# Patient Record
Sex: Male | Born: 1965 | Race: White | Hispanic: No | Marital: Single | Smoking: Current every day smoker
Health system: Southern US, Community
[De-identification: ages and names within clinical notes are randomized; demographics above are authoritative.]

## PROBLEM LIST (undated history)

## (undated) DIAGNOSIS — J449 Chronic obstructive pulmonary disease, unspecified: Secondary | ICD-10-CM

## (undated) DIAGNOSIS — R42 Dizziness and giddiness: Secondary | ICD-10-CM

## (undated) DIAGNOSIS — M549 Dorsalgia, unspecified: Secondary | ICD-10-CM

## (undated) DIAGNOSIS — J45909 Unspecified asthma, uncomplicated: Secondary | ICD-10-CM

## (undated) DIAGNOSIS — G8929 Other chronic pain: Secondary | ICD-10-CM

## (undated) DIAGNOSIS — F1910 Other psychoactive substance abuse, uncomplicated: Secondary | ICD-10-CM

## (undated) HISTORY — PX: APPENDECTOMY: SHX54

---

## 2017-05-03 ENCOUNTER — Encounter (HOSPITAL_BASED_OUTPATIENT_CLINIC_OR_DEPARTMENT_OTHER): Payer: Self-pay | Admitting: *Deleted

## 2017-05-03 ENCOUNTER — Emergency Department (HOSPITAL_BASED_OUTPATIENT_CLINIC_OR_DEPARTMENT_OTHER)
Admission: EM | Admit: 2017-05-03 | Discharge: 2017-05-03 | Disposition: A | Discharge status: Home / Self Care | Payer: Self-pay | Attending: Emergency Medicine | Admitting: Emergency Medicine

## 2017-05-03 DIAGNOSIS — J029 Acute pharyngitis, unspecified: Secondary | ICD-10-CM | POA: Insufficient documentation

## 2017-05-03 DIAGNOSIS — F17210 Nicotine dependence, cigarettes, uncomplicated: Secondary | ICD-10-CM | POA: Insufficient documentation

## 2017-05-03 DIAGNOSIS — B370 Candidal stomatitis: Secondary | ICD-10-CM | POA: Insufficient documentation

## 2017-05-03 DIAGNOSIS — M549 Dorsalgia, unspecified: Secondary | ICD-10-CM | POA: Insufficient documentation

## 2017-05-03 HISTORY — DX: Unspecified asthma, uncomplicated: J45.909

## 2017-05-03 HISTORY — DX: Other chronic pain: G89.29

## 2017-05-03 HISTORY — DX: Dorsalgia, unspecified: M54.9

## 2017-05-03 LAB — RAPID STREP SCREEN (MED CTR MEBANE ONLY): Streptococcus, Group A Screen (Direct): NEGATIVE

## 2017-05-03 MED ORDER — PENICILLIN G BENZATHINE 1200000 UNIT/2ML IM SUSP
1.2000 10*6.[IU] | Freq: Once | INTRAMUSCULAR | Status: AC
  Administered 2017-05-03: 1.2 10*6.[IU] via INTRAMUSCULAR
  Filled 2017-05-03: qty 2

## 2017-05-03 MED ORDER — NYSTATIN 100000 UNIT/ML MT SUSP: 500000.0000 [IU] | Freq: Four times a day (QID) | OROMUCOSAL | 0 refills | Status: DC

## 2017-05-03 NOTE — Discharge Instructions (Signed)
Today I have given you a shot of penicillin for sore throat.  I have also given you a prescription for nystatin mouthwash as your initial strep test was negative.  Infection with HIV can make you at risk for oral thrush (mouth yeast infection).  Please follow up with your PCP or the health department for a HIV test.

## 2017-05-03 NOTE — ED Notes (Signed)
Pt given d/c instructions as per chart. Rx x 1. Verbalizes understanding. No questions. 

## 2017-05-03 NOTE — ED Provider Notes (Signed)
[facility]   CSN: [id number] Arrival date & time: 05/03/17  1432     History   Chief Complaint Chief Complaint  Patient presents with  . Sore Throat  . Back Pain    HPI Robert Swanson is a 51 y.o. male who presents with two days of sore throat.  He reports he has had oral thrush before when he was taking steroid inhalers for his asthma.  He reports he has been attempting to treat his sore throat with gargling peroxide and has been "sometimes" diluting the hydrogen peroxide before gargling.  He denies fevers, cough, nausea, vomiting, abdominal pain, known sick contacts.    He reports that, while he does have chronic back pain, That he does not want it addressed while he is here, stating that he has a pain doctor and plans to follow up with them. He reports that his pain has not changed.  Twice patient gave verbal permission to discuss any results, treatment recommendations for him with his girlfriend present.    HPI  Past Medical History:  Diagnosis Date  . Asthma   . Chronic back pain     There are no active problems to display for this patient.   History reviewed. No pertinent surgical history.     Home Medications    Prior to Admission medications   Medication Sig Start Date End Date Taking? Authorizing Provider  nystatin (MYCOSTATIN) 100000 UNIT/ML suspension Take 5 mLs (500,000 Units total) by mouth 4 (four) times daily. 05/03/17   Cristina GongHammond, Syerra Abdelrahman W, PA-C    Family History No family history on file.  Social History Social History  Substance Use Topics  . Smoking status: Current Every Day Smoker    Packs/day: 0.50    Types: Cigarettes  . Smokeless tobacco: Never Used  . Alcohol use Yes     Allergies   Patient has no known allergies.   Review of Systems Review of Systems  Constitutional: Negative for activity change, appetite change, diaphoresis, fatigue and fever.  HENT: Positive for sore throat. Negative for dental  problem, drooling, ear pain, facial swelling, mouth sores, postnasal drip, rhinorrhea, sinus pressure, trouble swallowing and voice change.   Respiratory: Negative for chest tightness, shortness of breath and wheezing.   Gastrointestinal: Negative for diarrhea, nausea and vomiting.  Musculoskeletal: Negative for arthralgias and myalgias.  Skin: Negative for rash.  Neurological: Negative for syncope, light-headedness and headaches.     Physical Exam Updated Vital Signs BP 136/71 (BP Location: Left Arm)   Pulse 80   Temp 98.2 F (36.8 C) (Oral)   Resp 20   Ht 5\' 4"  (1.626 m)   Wt 175 lb (79.4 kg)   SpO2 95%   BMI 30.04 kg/m   Physical Exam  Constitutional: He appears well-developed and well-nourished. No distress.  HENT:  Head: Normocephalic and atraumatic.  Right Ear: External ear normal.  Left Ear: External ear normal.  Mouth/Throat: Uvula is midline and mucous membranes are normal. No dental abscesses or lacerations. Oropharyngeal exudate and posterior oropharyngeal erythema present. No posterior oropharyngeal edema. Tonsils are 2+ on the right. Tonsils are 2+ on the left. Tonsillar exudate.  Diffuse white patches to mouth on tongue, posterior pharynx and tonsils. There is diffuse erythema to his posterior pharynx  Neck: Normal range of motion. Neck supple. No tracheal deviation present.  Cardiovascular: Normal rate.   Pulmonary/Chest: Effort normal and breath sounds normal. No stridor. No respiratory distress. He has no wheezes.  Lymphadenopathy:  He has no cervical adenopathy.  Neurological: He is alert.  Skin: Skin is warm and dry. He is not diaphoretic.  Nursing note and vitals reviewed.    ED Treatments / Results  Labs (all labs ordered are listed, but only abnormal results are displayed) Labs Reviewed  RAPID STREP SCREEN (NOT AT [facility])  CULTURE, GROUP A STREP [facility])    EKG  EKG Interpretation None       Radiology No results  found.  Procedures Procedures (including critical care time)  Medications Ordered in ED Medications  penicillin g benzathine (BICILLIN LA) 1200000 UNIT/2ML injection 1.2 Million Units (1.2 Million Units Intramuscular Given 05/03/17 1548)     Initial Impression / Assessment and Plan / ED Course  I have reviewed the triage vital signs and the nursing notes.  Pertinent labs & imaging results that were available during my care of the patient were reviewed by me and considered in my medical decision making (see chart for details).    Pt afebrile with tonsillar exudate, no cervical lymphadenopathy, but patient concern for strep.   Treated in the ED with PCN IM. Based on negative rapid strep test and thrush history patient will be given rx for nystatin mouth wash for oral candidiasis.  Patient was informed that he needs to follow up with health center for HIV test as he does not have risk factors for oral candidiasis and needs evaluation.   Presentation non concerning for PTA or infxn spread to soft tissue. No trismus or uvula deviation. Specific return precautions discussed. Pt able to drink water in ED without difficulty with intact air way. Recommended PCP follow up.    Final Clinical Impressions(s) / ED Diagnoses   Final diagnoses:  Sore throat  Oral thrush    New Prescriptions Discharge Medication List as of 05/03/2017  3:40 PM    START taking these medications   Details  nystatin (MYCOSTATIN) 100000 UNIT/ML suspension Take 5 mLs (500,000 Units total) by mouth 4 (four) times daily., Starting Sun 05/03/2017, Print         Cristina Gong, PA-C 05/03/17 1735    Maia Plan, MD 05/04/17 [phone]

## 2017-05-03 NOTE — ED Triage Notes (Signed)
Pt with sore throat for 2-3 days.  Pt was self treating with hydrogen peroxide as he thought he may have thrush (from using his asthma inhaler).  Pt has very red throat on exam.  Pt also has chronic back pain he would like help with, pain has been in place for 6-7 years, no recent change

## 2017-05-03 NOTE — ED Notes (Signed)
Pt denies any s/s of allergic reaction to injection.

## 2017-05-03 NOTE — ED Notes (Signed)
ED Provider at bedside. 

## 2017-05-05 LAB — CULTURE, GROUP A STREP (THRC)

## 2017-05-12 ENCOUNTER — Encounter (HOSPITAL_BASED_OUTPATIENT_CLINIC_OR_DEPARTMENT_OTHER): Payer: Self-pay | Admitting: *Deleted

## 2017-05-12 ENCOUNTER — Emergency Department (HOSPITAL_BASED_OUTPATIENT_CLINIC_OR_DEPARTMENT_OTHER): Payer: Self-pay

## 2017-05-12 ENCOUNTER — Emergency Department (HOSPITAL_BASED_OUTPATIENT_CLINIC_OR_DEPARTMENT_OTHER)
Admission: EM | Admit: 2017-05-12 | Discharge: 2017-05-12 | Disposition: A | Discharge status: Home / Self Care | Payer: Self-pay | Attending: Emergency Medicine | Admitting: Emergency Medicine

## 2017-05-12 DIAGNOSIS — J441 Chronic obstructive pulmonary disease with (acute) exacerbation: Secondary | ICD-10-CM | POA: Insufficient documentation

## 2017-05-12 DIAGNOSIS — F17210 Nicotine dependence, cigarettes, uncomplicated: Secondary | ICD-10-CM | POA: Insufficient documentation

## 2017-05-12 DIAGNOSIS — J45909 Unspecified asthma, uncomplicated: Secondary | ICD-10-CM | POA: Insufficient documentation

## 2017-05-12 DIAGNOSIS — Z79899 Other long term (current) drug therapy: Secondary | ICD-10-CM | POA: Insufficient documentation

## 2017-05-12 MED ORDER — ALBUTEROL SULFATE (2.5 MG/3ML) 0.083% IN NEBU: 5.0000 mg | INHALATION_SOLUTION | Freq: Once | RESPIRATORY_TRACT | Status: DC

## 2017-05-12 MED ORDER — IPRATROPIUM-ALBUTEROL 0.5-2.5 (3) MG/3ML IN SOLN
3.0000 mL | Freq: Once | RESPIRATORY_TRACT | Status: AC
  Administered 2017-05-12: 3 mL via RESPIRATORY_TRACT
  Filled 2017-05-12: qty 3

## 2017-05-12 MED ORDER — ALBUTEROL SULFATE (2.5 MG/3ML) 0.083% IN NEBU
2.5000 mg | INHALATION_SOLUTION | Freq: Once | RESPIRATORY_TRACT | Status: AC
  Administered 2017-05-12: 2.5 mg via RESPIRATORY_TRACT
  Filled 2017-05-12: qty 3

## 2017-05-12 MED ORDER — ALBUTEROL SULFATE HFA 108 (90 BASE) MCG/ACT IN AERS
2.0000 | INHALATION_SPRAY | Freq: Four times a day (QID) | RESPIRATORY_TRACT | Status: DC
  Administered 2017-05-12: 2 via RESPIRATORY_TRACT
  Filled 2017-05-12: qty 6.7

## 2017-05-12 NOTE — ED Notes (Signed)
Out HFA x3 days

## 2017-05-12 NOTE — ED Triage Notes (Signed)
Pt presents with SOB x 3 days. Out of inhaler. Wheezing. RT at bedside

## 2017-05-12 NOTE — ED Notes (Signed)
Patient transported to X-ray in bed.

## 2017-05-12 NOTE — ED Notes (Signed)
ED Provider at bedside. 

## 2017-05-12 NOTE — ED Provider Notes (Signed)
[facility]   CSN: [id number] Arrival date & time: 05/12/17  1057     History   Chief Complaint Chief Complaint  Patient presents with  . Shortness of Breath    HPI Robert Swanson is a 51 y.o. male.  Patient is a smoker. Patient with complaint of shortness of breath for 3 days. Is out of his inhaler. Patient arrived with wheezing. Received a nebulizer treatment prior to being seen by me. Patient states that he's had a history of asthma.  Will also have a history of COPD.       Past Medical History:  Diagnosis Date  . Asthma   . Chronic back pain     There are no active problems to display for this patient.   Past Surgical History:  Procedure Laterality Date  . APPENDECTOMY         Home Medications    Prior to Admission medications   Medication Sig Start Date End Date Taking? Authorizing Provider  albuterol (PROVENTIL HFA;VENTOLIN HFA) 108 (90 Base) MCG/ACT inhaler Inhale 2 puffs into the lungs every 6 (six) hours as needed for wheezing or shortness of breath.   Yes [provider]  nystatin (MYCOSTATIN) 100000 UNIT/ML suspension Take 5 mLs (500,000 Units total) by mouth 4 (four) times daily. 05/03/17   Cristina Gong, PA-C    Family History No family history on file.  Social History Social History  Substance Use Topics  . Smoking status: Current Every Day Smoker    Packs/day: 0.50    Types: Cigarettes  . Smokeless tobacco: Never Used  . Alcohol use Yes     Comment: denies current use     Allergies   Patient has no known allergies.   Review of Systems Review of Systems  Constitutional: Negative for fever.  HENT: Negative for congestion.   Eyes: Negative for visual disturbance.  Respiratory: Positive for cough, shortness of breath and wheezing.   Cardiovascular: Negative for chest pain.  Gastrointestinal: Negative for abdominal pain.  Genitourinary: Negative for dysuria.  Musculoskeletal: Negative  for back pain.  Skin: Negative for rash.  Neurological: Negative for headaches.  Hematological: Does not bruise/bleed easily.  Psychiatric/Behavioral: Negative for confusion.     Physical Exam Updated Vital Signs BP (!) 141/73   Pulse (!) 51   Temp 97.9 F (36.6 C) (Oral)   Resp 18   Ht 1.626 m (5\' 4" )   Wt 79.4 kg (175 lb)   SpO2 100%   BMI 30.04 kg/m   Physical Exam  Constitutional: He is oriented to person, place, and time. He appears well-developed and well-nourished. No distress.  HENT:  Head: Normocephalic and atraumatic.  Left Ear: External ear normal.  Eyes: Conjunctivae and EOM are normal. Pupils are equal, round, and reactive to light.  Neck: Normal range of motion.  Cardiovascular: Normal rate and regular rhythm.   Pulmonary/Chest: Effort normal. He has wheezes.  Abdominal: Soft. Bowel sounds are normal. There is no tenderness.  Musculoskeletal: Normal range of motion. He exhibits no edema.  Neurological: He is alert and oriented to person, place, and time. No sensory deficit. He exhibits normal muscle tone. Coordination normal.  Skin: Skin is warm.  Nursing note and vitals reviewed.    ED Treatments / Results  Labs (all labs ordered are listed, but only abnormal results are displayed) Labs Reviewed - No data to display  EKG  EKG Interpretation  Date/Time:  Tuesday May 12 2017 11:16:58 EDT Ventricular Rate:  60 PR Interval:    QRS Duration: 108 QT Interval:  404 QTC Calculation: 404 R Axis:   90 Text Interpretation:  Sinus rhythm Borderline right axis deviation Low voltage, precordial leads No previous ECGs available Confirmed by Vanetta MuldersZackowski, Danya Spearman [phone]) on 05/12/2017 11:21:14 AM       Radiology Dg Chest 2 View  Result Date: 05/12/2017 CLINICAL DATA:  Shortness of breath. EXAM: CHEST  2 VIEW COMPARISON:  No prior . FINDINGS: Mediastinum and hilar structures are normal. Lungs are clear. No pleural effusion or pneumothorax. Cardiomegaly with normal  pulmonary vascularity. No acute bony abnormality . IMPRESSION: 1. Cardiomegaly with normal pulmonary vascularity. 2. No acute pulmonary disease. Electronically Signed   By: Maisie Fushomas  Register   On: 05/12/2017 11:38    Procedures Procedures (including critical care time)  Medications Ordered in ED Medications  albuterol (PROVENTIL HFA;VENTOLIN HFA) 108 (90 Base) MCG/ACT inhaler 2 puff (not administered)  ipratropium-albuterol (DUONEB) 0.5-2.5 (3) MG/3ML nebulizer solution 3 mL (3 mLs Nebulization Given 05/12/17 1113)  albuterol (PROVENTIL) (2.5 MG/3ML) 0.083% nebulizer solution 2.5 mg (2.5 mg Nebulization Given 05/12/17 1113)  ipratropium-albuterol (DUONEB) 0.5-2.5 (3) MG/3ML nebulizer solution 3 mL (3 mLs Nebulization Given 05/12/17 1329)     Initial Impression / Assessment and Plan / ED Course  I have reviewed the triage vital signs and the nursing notes.  Pertinent labs & imaging results that were available during my care of the patient were reviewed by me and considered in my medical decision making (see chart for details).     Patient presenting for with shortness of breath and wheezing. Ran out of his inhaler. Has a history of asthma or COPD. He is a smoker. Chest x-ray here did today is negative for any acute findings. Patient improved after 2 nebulizer treatments. Wheezing has resolved. We'll provide him with an inhaler.  Final Clinical Impressions(s) / ED Diagnoses   Final diagnoses:  COPD exacerbation (HCC)    New Prescriptions New Prescriptions   No medications on file     Vanetta MuldersZackowski, Oswin Griffith, MD 05/12/17 1346

## 2017-05-12 NOTE — Discharge Instructions (Signed)
Use albuterol inhaler 2 puffs every 6 hours. Return for any new or worse symptoms.

## 2017-05-21 ENCOUNTER — Other Ambulatory Visit: Payer: Self-pay

## 2017-05-21 ENCOUNTER — Emergency Department (HOSPITAL_BASED_OUTPATIENT_CLINIC_OR_DEPARTMENT_OTHER)
Admission: EM | Admit: 2017-05-21 | Discharge: 2017-05-21 | Disposition: A | Discharge status: Home / Self Care | Payer: Self-pay | Attending: Emergency Medicine | Admitting: Emergency Medicine

## 2017-05-21 ENCOUNTER — Encounter (HOSPITAL_BASED_OUTPATIENT_CLINIC_OR_DEPARTMENT_OTHER): Payer: Self-pay | Admitting: Emergency Medicine

## 2017-05-21 DIAGNOSIS — J9801 Acute bronchospasm: Secondary | ICD-10-CM | POA: Insufficient documentation

## 2017-05-21 DIAGNOSIS — J449 Chronic obstructive pulmonary disease, unspecified: Secondary | ICD-10-CM | POA: Insufficient documentation

## 2017-05-21 DIAGNOSIS — F17210 Nicotine dependence, cigarettes, uncomplicated: Secondary | ICD-10-CM | POA: Insufficient documentation

## 2017-05-21 HISTORY — DX: Chronic obstructive pulmonary disease, unspecified: J44.9

## 2017-05-21 MED ORDER — ALBUTEROL SULFATE HFA 108 (90 BASE) MCG/ACT IN AERS
4.0000 | INHALATION_SPRAY | Freq: Once | RESPIRATORY_TRACT | Status: AC
  Administered 2017-05-21: 4 via RESPIRATORY_TRACT
  Filled 2017-05-21: qty 6.7

## 2017-05-21 NOTE — Discharge Instructions (Signed)
Return to the ED with any concerns including difficulty breathing despite using albuterol every 4 hours, not drinking fluids, decreased urine output, vomiting and not able to keep down liquids or medications, decreased level of alertness/lethargy, or any other alarming symptoms °

## 2017-05-21 NOTE — ED Triage Notes (Signed)
Pt c/o dizziness and nausea since yesterday.

## 2017-05-21 NOTE — ED Triage Notes (Signed)
Pt with slow speech and falls asleep quickly. Pt appears under the influence of a mood altering substance.

## 2017-05-21 NOTE — ED Provider Notes (Signed)
[facility]   CSN: [id number] Arrival date & time: 05/21/17  0433     History   Chief Complaint Chief Complaint  Patient presents with  . Dizziness    HPI Robert Swanson is a 51 y.o. male.  HPI  Pt with hx of asthma/copd and chronic back pain presenting with c/o feeling shortness of breath- he states that he lost his inhaler that was given to him several days ago.  He also c/o feeling some mild lightheadedness. Denies chest pain.  No leg swelling.  No fever/chill. Denies any change in sputum. States he had been doing well since ED visit several days ago until he lost the inhaler. There are no other associated systemic symptoms, there are no other alleviating or modifying factors.   Past Medical History:  Diagnosis Date  . Asthma   . Chronic back pain   . COPD (chronic obstructive pulmonary disease) (HCC)     There are no active problems to display for this patient.   Past Surgical History:  Procedure Laterality Date  . APPENDECTOMY         Home Medications    Prior to Admission medications   Medication Sig Start Date End Date Taking? Authorizing Provider  albuterol (PROVENTIL HFA;VENTOLIN HFA) 108 (90 Base) MCG/ACT inhaler Inhale 2 puffs into the lungs every 6 (six) hours as needed for wheezing or shortness of breath.    [provider]    Family History No family history on file.  Social History Social History  Substance Use Topics  . Smoking status: Current Every Day Smoker    Packs/day: 0.50    Types: Cigarettes  . Smokeless tobacco: Never Used  . Alcohol use Yes     Comment: denies current use     Allergies   Patient has no known allergies.   Review of Systems Review of Systems  ROS reviewed and all otherwise negative except for mentioned in HPI   Physical Exam Updated Vital Signs BP 118/71 (BP Location: Right Arm)   Pulse 68   Temp 97.7 F (36.5 C) (Oral)   Resp 16   Ht 5\' 4"  (1.626 m)   Wt 81.6 kg  (180 lb)   SpO2 98%   BMI 30.90 kg/m  Vitals reviewed Physical Exam Physical Examination: General appearance - alert, well appearing, and in no distress Mental status - alert, oriented to person, place, and time Eyes - no conjunctival injection, no scleral icterus Chest - BSS, bilateral expiratory wheezing, normal respiraotory effort Heart - normal rate, regular rhythm, normal S1, S2, no murmurs, rubs, clicks or gallops Abdomen - soft, nontender, nondistended, no masses or organomegaly Neurological - alert, oriented x 3, normal speech, but appears tired Extremities - peripheral pulses normal, no pedal edema, no clubbing or cyanosis Skin - normal coloration and turgor, no rashes  ED Treatments / Results  Labs (all labs ordered are listed, but only abnormal results are displayed) Labs Reviewed - No data to display  EKG  EKG Interpretation None       Date: 05/21/2017  Rate: 61  Rhythm: normal sinus rhythm  QRS Axis: normal  Intervals: normal  ST/T Wave abnormalities: normal  Conduction Disutrbances: none  Narrative Interpretation: unremarkable    Radiology No results found.  Procedures Procedures (including critical care time)  Medications Ordered in ED Medications  albuterol (PROVENTIL HFA;VENTOLIN HFA) 108 (90 Base) MCG/ACT inhaler 4 puff (4 puffs Inhalation Given 05/21/17 0523)     Initial Impression /  Assessment and Plan / ED Course  I have reviewed the triage vital signs and the nursing notes.  Pertinent labs & imaging results that were available during my care of the patient were reviewed by me and considered in my medical decision making (see chart for details).     Pt presenting with ongoing wheezing since losing the inhaler that was given to him at last ED visit.  Pt is is no distress, wheezing on exam is mild.  EKG reassuring.  Doubt pneumonia, ACS, or other acute emergent process at this time. Pt is requesting ativan for anxiety but appears very sleepy.   I have discussed with him that I do no feel comfortable giving a sedative medication to someone who already appears sedated.  He also discusses that he can no longer afford to see his pain clinic for his chornic pain meds.  No indication for pain meds at this time.  Discharged with strict return precautions.  Pt agreeable with plan.  Final Clinical Impressions(s) / ED Diagnoses   Final diagnoses:  Bronchospasm    New Prescriptions Discharge Medication List as of 05/21/2017  6:27 AM       Jerelyn Scott, MD 05/22/17 [phone]

## 2017-05-23 ENCOUNTER — Emergency Department (HOSPITAL_BASED_OUTPATIENT_CLINIC_OR_DEPARTMENT_OTHER)
Admission: EM | Admit: 2017-05-23 | Discharge: 2017-05-23 | Disposition: A | Discharge status: Home / Self Care | Payer: Self-pay | Attending: Emergency Medicine | Admitting: Emergency Medicine

## 2017-05-23 ENCOUNTER — Encounter (HOSPITAL_BASED_OUTPATIENT_CLINIC_OR_DEPARTMENT_OTHER): Payer: Self-pay

## 2017-05-23 ENCOUNTER — Emergency Department (HOSPITAL_BASED_OUTPATIENT_CLINIC_OR_DEPARTMENT_OTHER): Payer: Self-pay

## 2017-05-23 DIAGNOSIS — R110 Nausea: Secondary | ICD-10-CM | POA: Insufficient documentation

## 2017-05-23 DIAGNOSIS — G8929 Other chronic pain: Secondary | ICD-10-CM | POA: Insufficient documentation

## 2017-05-23 DIAGNOSIS — J45909 Unspecified asthma, uncomplicated: Secondary | ICD-10-CM | POA: Insufficient documentation

## 2017-05-23 DIAGNOSIS — R42 Dizziness and giddiness: Secondary | ICD-10-CM

## 2017-05-23 DIAGNOSIS — J449 Chronic obstructive pulmonary disease, unspecified: Secondary | ICD-10-CM | POA: Insufficient documentation

## 2017-05-23 DIAGNOSIS — F17210 Nicotine dependence, cigarettes, uncomplicated: Secondary | ICD-10-CM | POA: Insufficient documentation

## 2017-05-23 DIAGNOSIS — R002 Palpitations: Secondary | ICD-10-CM | POA: Insufficient documentation

## 2017-05-23 LAB — RAPID URINE DRUG SCREEN, HOSP PERFORMED
Amphetamines: NOT DETECTED
Barbiturates: NOT DETECTED
Benzodiazepines: NOT DETECTED
Cocaine: POSITIVE — AB
Opiates: NOT DETECTED
Tetrahydrocannabinol: NOT DETECTED

## 2017-05-23 LAB — CBC WITH DIFFERENTIAL/PLATELET
Basophils Absolute: 0.1 10*3/uL (ref 0.0–0.1)
Basophils Relative: 1 %
Eosinophils Absolute: 0.4 10*3/uL (ref 0.0–0.7)
Eosinophils Relative: 3 %
HCT: 44.1 % (ref 39.0–52.0)
Hemoglobin: 14.5 g/dL (ref 13.0–17.0)
Lymphocytes Relative: 15 %
Lymphs Abs: 1.6 10*3/uL (ref 0.7–4.0)
MCH: 30.3 pg (ref 26.0–34.0)
MCHC: 32.9 g/dL (ref 30.0–36.0)
MCV: 92.1 fL (ref 78.0–100.0)
Monocytes Absolute: 0.8 10*3/uL (ref 0.1–1.0)
Monocytes Relative: 7 %
Neutro Abs: 8.3 10*3/uL — ABNORMAL HIGH (ref 1.7–7.7)
Neutrophils Relative %: 74 %
Platelets: 308 10*3/uL (ref 150–400)
RBC: 4.79 MIL/uL (ref 4.22–5.81)
RDW: 13.8 % (ref 11.5–15.5)
WBC: 11.1 10*3/uL — ABNORMAL HIGH (ref 4.0–10.5)

## 2017-05-23 LAB — COMPREHENSIVE METABOLIC PANEL
ALT: 12 U/L — ABNORMAL LOW (ref 17–63)
AST: 13 U/L — ABNORMAL LOW (ref 15–41)
Albumin: 3.8 g/dL (ref 3.5–5.0)
Alkaline Phosphatase: 53 U/L (ref 38–126)
Anion gap: 6 (ref 5–15)
BUN: 15 mg/dL (ref 6–20)
CO2: 28 mmol/L (ref 22–32)
Calcium: 9.1 mg/dL (ref 8.9–10.3)
Chloride: 107 mmol/L (ref 101–111)
Creatinine, Ser: 0.9 mg/dL (ref 0.61–1.24)
GFR calc Af Amer: >60 mL/min (ref >60)
GFR calc non Af Amer: >60 mL/min (ref >60)
Glucose, Bld: 101 mg/dL — ABNORMAL HIGH (ref 65–99)
Potassium: 3.8 mmol/L (ref 3.5–5.1)
Sodium: 141 mmol/L (ref 135–145)
Total Bilirubin: 0.8 mg/dL (ref 0.3–1.2)
Total Protein: 6.6 g/dL (ref 6.5–8.1)

## 2017-05-23 LAB — MAGNESIUM: Magnesium: 1.9 mg/dL (ref 1.7–2.4)

## 2017-05-23 LAB — TROPONIN I: Troponin I: <0.03 ng/mL (ref <0.03)

## 2017-05-23 LAB — D-DIMER, QUANTITATIVE (NOT AT ARMC): D-Dimer, Quant: <0.27 ug/mL-FEU (ref 0.00–0.50)

## 2017-05-23 MED ORDER — SODIUM CHLORIDE 0.9 % IV BOLUS (SEPSIS)
1000.0000 mL | Freq: Once | INTRAVENOUS | Status: AC
  Administered 2017-05-23: 1000 mL via INTRAVENOUS

## 2017-05-23 NOTE — ED Notes (Signed)
Pt reports intermittent lightheadedness that has been going on 6 mo to a year. Pt reports that it has recently been worse. Pt states "I have been scared, and I am afraid to go to sleep. I will just walk around at night." Pt reports being given ativan in the past and that has helped his symptoms. Pt states when he gets scared the lightheadedness gets worse, he feels like the walls close in, his heart races.

## 2017-05-23 NOTE — ED Triage Notes (Addendum)
PT reports dizziness, shortness of breath since 0600 - states intermittent - denies pain. Reports he is annoyed by certain things and he is scared to lay down. He reports he "feels safe here in the ER."

## 2017-05-23 NOTE — ED Provider Notes (Signed)
[facility] Provider Note   CSN: [id number] Arrival date & time: 05/23/17  1417  By signing my name below, I, Diona BrownerJennifer Gorman, attest that this documentation has been prepared under the direction and in the presence of Alvira MondaySchlossman, Arlynn Mcdermid, MD. Electronically Signed: Diona BrownerJennifer Gorman, ED Scribe. 05/23/17. 3:42 PM.  History   Chief Complaint Chief Complaint  Patient presents with  . Dizziness    HPI Robert Swanson is a 51 y.o. male who presents to the Emergency Department complaining of gradually worsening, intermittent dizziness that started a few days ago. He feels like if he lies down he will stop breathing and won't wake up. Pt reports he has been feeling this way for the last 6 months. Associated sx include nausea, heart palpitations, SOB (7/10), wheezing, non productive cough, chills, subjective fever, diaphoresis, feels like he is going to pass out when he is in the heat. Walking improves his dizziness. No new medications. Stopped taking oxycodone for his back ~ month. Doesn't drink ETOH. He does smokes. He hasn't been evaluated for his sx in the past by anyone other than the ED. FHx of anxiety, but not sudden death. Pt denies CP, vomiting, black and tarry stool, blood in stool, sore throat, rhinorrhea, dysuria, and leg pain or swelling.  The history is provided by the patient. No language interpreter was used.    Past Medical History:  Diagnosis Date  . Asthma   . Chronic back pain   . COPD (chronic obstructive pulmonary disease) (HCC)     There are no active problems to display for this patient.   Past Surgical History:  Procedure Laterality Date  . APPENDECTOMY         Home Medications    Prior to Admission medications   Medication Sig Start Date End Date Taking? Authorizing Provider  albuterol (PROVENTIL HFA;VENTOLIN HFA) 108 (90 Base) MCG/ACT inhaler Inhale 2 puffs into the lungs every 6 (six) hours as needed for wheezing or shortness of breath.   Yes  [provider]    Family History History reviewed. No pertinent family history.  Social History Social History  Substance Use Topics  . Smoking status: Current Every Day Smoker    Packs/day: 0.50    Types: Cigarettes  . Smokeless tobacco: Never Used  . Alcohol use Yes     Comment: denies current use     Allergies   Patient has no known allergies.   Review of Systems Review of Systems  Constitutional: Positive for chills, diaphoresis and fatigue. Negative for fever (feels hot off and on).  HENT: Positive for congestion. Negative for rhinorrhea and sore throat.   Eyes: Negative for visual disturbance.  Respiratory: Positive for cough (occasional), shortness of breath (off and on, worse today) and wheezing.   Cardiovascular: Negative for chest pain and leg swelling.  Gastrointestinal: Positive for nausea. Negative for abdominal pain, constipation, diarrhea and vomiting.  Genitourinary: Negative for difficulty urinating and dysuria.  Musculoskeletal: Negative for back pain and neck stiffness.  Skin: Negative for rash.  Neurological: Positive for dizziness and light-headedness. Negative for syncope, facial asymmetry, speech difficulty, weakness, numbness and headaches.  All other systems reviewed and are negative.    Physical Exam Updated Vital Signs BP 116/65   Pulse 73   Temp 98.2 F (36.8 C) (Oral)   Resp (!) 21   Ht 5\' 4"  (1.626 m)   Wt 81.6 kg (180 lb)   SpO2 95%   BMI 30.90 kg/m   Physical Exam  Constitutional: He is oriented to person, place, and time. He appears well-developed and well-nourished. No distress.  HENT:  Head: Normocephalic and atraumatic.  Eyes: Conjunctivae and EOM are normal. Pupils are equal, round, and reactive to light.  Neck: Normal range of motion.  Cardiovascular: Normal rate, regular rhythm, normal heart sounds and intact distal pulses.  Exam reveals no gallop and no friction rub.   No murmur heard. Pulmonary/Chest: Effort  normal. No respiratory distress. He has wheezes (diffuse). He has no rales. He exhibits no tenderness.  Abdominal: Soft. Bowel sounds are normal. He exhibits no distension. There is no tenderness. There is no guarding.  Musculoskeletal: He exhibits no edema.  Neurological: He is alert and oriented to person, place, and time. No cranial nerve deficit or sensory deficit. He exhibits normal muscle tone. Coordination normal. GCS eye subscore is 4. GCS verbal subscore is 5. GCS motor subscore is 6.   Strength and sensation equal and intact bilaterally throughout the upper and lower extremities. Coordination intact.    Skin: Skin is warm and dry. He is not diaphoretic.  Nursing note and vitals reviewed.    ED Treatments / Results  DIAGNOSTIC STUDIES: Oxygen Saturation is 94% on RA, adequate by my interpretation.   COORDINATION OF CARE: 3:42 PM-Discussed next steps with pt which includes blood work and possible CT if blood work comes back positive and following up with a PCP. Pt verbalized understanding and is agreeable with the plan.   Labs (all labs ordered are listed, but only abnormal results are displayed) Labs Reviewed  CBC WITH DIFFERENTIAL/PLATELET - Abnormal; Notable for the following:       Result Value   WBC 11.1 (*)    Neutro Abs 8.3 (*)    All other components within normal limits  COMPREHENSIVE METABOLIC PANEL - Abnormal; Notable for the following:    Glucose, Bld 101 (*)    AST 13 (*)    ALT 12 (*)    All other components within normal limits  RAPID URINE DRUG SCREEN, HOSP PERFORMED - Abnormal; Notable for the following:    Cocaine POSITIVE (*)    All other components within normal limits  MAGNESIUM  D-DIMER, QUANTITATIVE (NOT AT [facility])  TROPONIN I    EKG  EKG Interpretation  Date/Time:  Saturday May 23 2017 14:22:19 EDT Ventricular Rate:  76 PR Interval:    QRS Duration: 109 QT Interval:  382 QTC Calculation: 430 R Axis:   -146 Text Interpretation:  Sinus  rhythm Right axis deviation No significant change since last tracing Reconfirmed by Alvira Monday ([id number]) on 05/23/2017 4:56:17 PM       Radiology Dg Chest 2 View  Result Date: 05/23/2017 CLINICAL DATA:  Shortness of breath and cardiac palpitations EXAM: CHEST  2 VIEW COMPARISON:  May 12, 2017 FINDINGS: There is no edema or consolidation. Heart size and pulmonary vascularity are normal. No adenopathy. No evident bone lesions. IMPRESSION: No edema or consolidation. Electronically Signed   By: Bretta Bang III M.D.   On: 05/23/2017 15:59    Procedures Procedures (including critical care time)  Medications Ordered in ED Medications  sodium chloride 0.9 % bolus 1,000 mL (0 mLs Intravenous Stopped 05/23/17 1837)     Initial Impression / Assessment and Plan / ED Course  I have reviewed the triage vital signs and the nursing notes.  Pertinent labs & imaging results that were available during my care of the patient were reviewed by me and considered in my medical decision  making (see chart for details).     51 year old male with a history of COPD, chronic back pain presents with concern for intermittent episodes of lightheadedness over the last 6 months, with this episode beginning 2 days ago.  Patient was seen in the emergency department 2 days ago for the same. EKG shows no significant change from prior. CBC shows no significant anemia. No significant electrolyte abnormalities.  Chest x-ray shows no acute abnormalities.Given dyspnea, d-dimer was ordered which was negative.  Troponin was negative. Urinalysis shows no sign of infection.  Cocaine positive on UDS.  Patient is hemodynamically stable in the emergency department, with normal heart rhythm on telemetry. His nausea and chills may be secondary to opiate withdrawal. Discussed that his other symptoms may be secondary to anxiety, however recommend more thorough outpatient evaluation for etiology, including Holter monitoring.   Final  Clinical Impressions(s) / ED Diagnoses   Final diagnoses:  Nausea  Lightheadedness  Palpitations    New Prescriptions Discharge Medication List as of 05/23/2017  6:30 PM     I personally performed the services described in this documentation, which was scribed in my presence. The recorded information has been reviewed and is accurate.     Alvira Monday, MD 05/24/17 [phone]

## 2017-05-23 NOTE — ED Notes (Signed)
Pt denies any chest pain, reports some ShOB that is he attributes to his COPD at this time.

## 2017-06-14 ENCOUNTER — Emergency Department (HOSPITAL_BASED_OUTPATIENT_CLINIC_OR_DEPARTMENT_OTHER)
Admission: EM | Admit: 2017-06-14 | Discharge: 2017-06-14 | Disposition: A | Discharge status: Home / Self Care | Payer: Self-pay | Attending: Emergency Medicine | Admitting: Emergency Medicine

## 2017-06-14 ENCOUNTER — Encounter (HOSPITAL_BASED_OUTPATIENT_CLINIC_OR_DEPARTMENT_OTHER): Payer: Self-pay | Admitting: *Deleted

## 2017-06-14 DIAGNOSIS — R05 Cough: Secondary | ICD-10-CM | POA: Insufficient documentation

## 2017-06-14 DIAGNOSIS — F17210 Nicotine dependence, cigarettes, uncomplicated: Secondary | ICD-10-CM | POA: Insufficient documentation

## 2017-06-14 DIAGNOSIS — Z7951 Long term (current) use of inhaled steroids: Secondary | ICD-10-CM | POA: Insufficient documentation

## 2017-06-14 DIAGNOSIS — J441 Chronic obstructive pulmonary disease with (acute) exacerbation: Secondary | ICD-10-CM | POA: Insufficient documentation

## 2017-06-14 MED ORDER — ALBUTEROL SULFATE HFA 108 (90 BASE) MCG/ACT IN AERS: 2.0000 | INHALATION_SPRAY | RESPIRATORY_TRACT | 2 refills | Status: DC | PRN

## 2017-06-14 MED ORDER — ALBUTEROL SULFATE HFA 108 (90 BASE) MCG/ACT IN AERS
2.0000 | INHALATION_SPRAY | Freq: Once | RESPIRATORY_TRACT | Status: AC
  Administered 2017-06-14: 2 via RESPIRATORY_TRACT
  Filled 2017-06-14: qty 6.7

## 2017-06-14 MED ORDER — ALBUTEROL SULFATE HFA 108 (90 BASE) MCG/ACT IN AERS
INHALATION_SPRAY | RESPIRATORY_TRACT | Status: AC
  Administered 2017-06-14: 2 via RESPIRATORY_TRACT
  Filled 2017-06-14: qty 6.7

## 2017-06-14 MED ORDER — IPRATROPIUM BROMIDE 0.02 % IN SOLN
0.5000 mg | Freq: Once | RESPIRATORY_TRACT | Status: AC
  Administered 2017-06-14: 0.5 mg via RESPIRATORY_TRACT
  Filled 2017-06-14: qty 2.5

## 2017-06-14 MED ORDER — PREDNISONE 50 MG PO TABS
60.0000 mg | ORAL_TABLET | Freq: Once | ORAL | Status: AC
  Administered 2017-06-14: 06:00:00 60 mg via ORAL
  Filled 2017-06-14: qty 1

## 2017-06-14 MED ORDER — ALBUTEROL SULFATE (2.5 MG/3ML) 0.083% IN NEBU
5.0000 mg | INHALATION_SOLUTION | Freq: Once | RESPIRATORY_TRACT | Status: AC
  Administered 2017-06-14: 5 mg via RESPIRATORY_TRACT
  Filled 2017-06-14: qty 6

## 2017-06-14 MED ORDER — PREDNISONE 20 MG PO TABS: 60.0000 mg | ORAL_TABLET | Freq: Every day | ORAL | 0 refills | Status: DC

## 2017-06-14 NOTE — ED Provider Notes (Signed)
TIME SEEN: 5:52 AM  CHIEF COMPLAINT: Cough, wheezing  HPI: Patient is a 51 year old male with history of asthma, COPD, chronic back pain, tobacco use who presents emergency department with several days of dry cough, wheezing. States he ran out of his albuterol inhaler. Has also been without his Symbicort for several months after losing his job and insurance.  He reports he cannot afford these medications and that his [facility] was giving him "samples" of both. 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 States when he begins to feel like this he knows it's time for breathing treatment and steroids. He denies any chest pain or chest discomfort. No fevers or chills. No leg swelling or pain. In no significant distress. No significant shortness of breath.  Does not wear oxygen chronically.  Reports he does not currently have a primary care physician.  ROS: See HPI Constitutional: no fever  Eyes: no drainage  ENT: no runny nose   Cardiovascular:  no chest pain  Resp: no SOB  GI: no vomiting GU: no dysuria Integumentary: no rash  Allergy: no hives  Musculoskeletal: no leg swelling  Neurological: no slurred speech ROS otherwise negative  PAST MEDICAL HISTORY/PAST SURGICAL HISTORY:  Past Medical History:  Diagnosis Date  . Asthma   . Chronic back pain   . COPD (chronic obstructive pulmonary disease) (HCC)     MEDICATIONS:  Prior to Admission medications   Medication Sig Start Date End Date Taking? Authorizing Provider  albuterol (PROVENTIL HFA;VENTOLIN HFA) 108 (90 Base) MCG/ACT inhaler Inhale 2 puffs into the lungs every 6 (six) hours as needed for wheezing or shortness of breath.    [provider]    ALLERGIES:  No Known Allergies  SOCIAL HISTORY:  Social History  Substance Use Topics  . Smoking status: Current Every Day  Smoker    Packs/day: 0.50    Types: Cigarettes  . Smokeless tobacco: Never Used  . Alcohol use Yes     Comment: denies current use    FAMILY HISTORY: No family history on file.  EXAM: BP 124/66 (BP Location: Right Arm)   Pulse 80   Temp 97.9 F (36.6 C) (Oral)   Resp 18   Ht 5\' 4"  (1.626 m)   Wt 81.6 kg (180 lb)   SpO2 95%   BMI 30.90 kg/m  CONSTITUTIONAL: Alert and oriented and responds appropriately to questions. Well-appearing; well-nourished HEAD: Normocephalic EYES: Conjunctivae clear, pupils appear equal, EOMI ENT: normal nose; moist mucous membranes NECK: Supple, no meningismus, no nuchal rigidity, no LAD  CARD: RRR; S1 and S2 appreciated; no murmurs, no clicks, no rubs, no gallops RESP: Normal chest excursion without splinting or tachypnea; breath sounds equal bilaterally; Scattered expiratory wheezes, no rhonchi, no rales, no hypoxia or respiratory distress, speaking full sentences ABD/GI: Normal bowel sounds; non-distended; soft, non-tender, no rebound, no guarding, no peritoneal signs, no hepatosplenomegaly BACK:  The back appears normal and is non-tender to palpation, there is no CVA tenderness EXT: Normal ROM in all joints; non-tender to palpation; no edema; normal capillary refill; no cyanosis, no calf tenderness or swelling    SKIN: Normal color for age and race; warm; no rash NEURO: Moves all extremities equally PSYCH: The patient's mood and manner are appropriate. Grooming and personal hygiene are appropriate.  MEDICAL DECISION MAKING: Patient here with mild wheezing  and dry cough. Does have history of asthma, COPD. Not hypoxic here and in no respiratory distress. We'll give albuterol, Atrovent, prednisone. We'll also give him an albuterol inhaler to take home with him. He has no chest pain or shortness of breath. No fevers. No asymmetry when I listen to his lungs. We have discussed obtaining a chest x-ray and he would like to hold off at this time which I agree  is reasonable. I do not think this is a pneumothorax or pneumonia. No sign of volume overload.  ED PROGRESS: 6:25 AM  Pt's Lungs are almost completely clear after one breathing treatment. Given 5 mg albuterol, 0.5 mg Atrovent. Have offered him a second breathing treatment but he states he is feeling much better and feels ready for discharge. He has been provided with an albuterol inhaler to take home and prescription for steroid burst and more albuterol inhalers with refills. He states that he cannot afford Symbicort at this time. He previously had a nebulizer machine stated broke. He was given a prescription for a new machine by his primary care physician but states he cannot afford this either. He states he should be getting insurance with his new job soon and is also filing for disability. I have given him a list of primary care physicians that may work with him without insurance and help him obtain his prescriptions. We have discussed looking for coupons for medications on the Internet including [email or web address].  Discussed return precautions. Patient verbalizes understanding and is comfortable with this plan.  At this time, I do not feel there is any life-threatening condition present. I have reviewed and discussed all results (EKG, imaging, lab, urine as appropriate) and exam findings with patient/family. I have reviewed nursing notes and appropriate previous records.  I feel the patient is safe to be discharged home without further emergent workup and can continue workup as an outpatient as needed. Discussed usual and customary return precautions. Patient/family verbalize understanding and are comfortable with this plan.  Outpatient follow-up has been provided if needed. All questions have been answered.        Tiearra Colwell, Layla Maw, DO 06/14/17 [phone]

## 2017-06-14 NOTE — ED Notes (Signed)
ED Provider at bedside. 

## 2017-06-14 NOTE — ED Triage Notes (Signed)
Pt reports wheezing and cough for several days. He reports he ran out of his inhaler.

## 2017-06-14 NOTE — Discharge Instructions (Addendum)
GoodRx.com is a website you can use to find coupons to help with your prescriptions.   To find a primary care or specialty doctor please call [phone] or [phone] to access "[place] Find a Doctor Service."  You may also go on the [facility] website at [email or web address]  There are also multiple [facility], [facility], [facility] and [facility] practices throughout the [place] that are frequently accepting new patients. You may find a clinic that is close to your home and contact them.  [facility] -  [address] [place] [phone]   [facility] -  [address] [place] [phone] [phone]   [facility] [phone]  [place] [phone]

## 2017-06-21 ENCOUNTER — Emergency Department (HOSPITAL_BASED_OUTPATIENT_CLINIC_OR_DEPARTMENT_OTHER)
Admission: EM | Admit: 2017-06-21 | Discharge: 2017-06-21 | Disposition: A | Discharge status: Home / Self Care | Payer: Self-pay | Attending: Emergency Medicine | Admitting: Emergency Medicine

## 2017-06-21 ENCOUNTER — Encounter (HOSPITAL_BASED_OUTPATIENT_CLINIC_OR_DEPARTMENT_OTHER): Payer: Self-pay | Admitting: Emergency Medicine

## 2017-06-21 DIAGNOSIS — J45909 Unspecified asthma, uncomplicated: Secondary | ICD-10-CM | POA: Insufficient documentation

## 2017-06-21 DIAGNOSIS — J441 Chronic obstructive pulmonary disease with (acute) exacerbation: Secondary | ICD-10-CM | POA: Insufficient documentation

## 2017-06-21 DIAGNOSIS — F17210 Nicotine dependence, cigarettes, uncomplicated: Secondary | ICD-10-CM | POA: Insufficient documentation

## 2017-06-21 MED ORDER — PREDNISONE 50 MG PO TABS
60.0000 mg | ORAL_TABLET | Freq: Once | ORAL | Status: AC
  Administered 2017-06-21: 09:00:00 60 mg via ORAL
  Filled 2017-06-21: qty 1

## 2017-06-21 MED ORDER — ALBUTEROL SULFATE HFA 108 (90 BASE) MCG/ACT IN AERS
2.0000 | INHALATION_SPRAY | RESPIRATORY_TRACT | Status: DC
  Administered 2017-06-21: 2 via RESPIRATORY_TRACT
  Filled 2017-06-21: qty 6.7

## 2017-06-21 MED ORDER — IPRATROPIUM BROMIDE 0.02 % IN SOLN
0.5000 mg | Freq: Once | RESPIRATORY_TRACT | Status: AC
  Administered 2017-06-21: 0.5 mg via RESPIRATORY_TRACT
  Filled 2017-06-21: qty 2.5

## 2017-06-21 MED ORDER — ALBUTEROL SULFATE (2.5 MG/3ML) 0.083% IN NEBU
5.0000 mg | INHALATION_SOLUTION | Freq: Once | RESPIRATORY_TRACT | Status: AC
  Administered 2017-06-21: 5 mg via RESPIRATORY_TRACT
  Filled 2017-06-21: qty 6

## 2017-06-21 MED ORDER — PREDNISONE 50 MG PO TABS: 50.0000 mg | ORAL_TABLET | Freq: Every day | ORAL | 0 refills | Status: DC

## 2017-06-21 NOTE — ED Triage Notes (Signed)
States " I lost my inhaler and I was here last week and I did not get my steroid prescription filled " SOB with exertion

## 2017-06-21 NOTE — ED Provider Notes (Signed)
[facility] Provider Note   CSN: [id number] Arrival date & time: 06/21/17  0801     History   Chief Complaint Chief Complaint  Patient presents with  . Shortness of Breath    HPI Robert Swanson is a 51 y.o. male.  HPI  Pt with hx of asthma/copd presenting with c/o ongoign shortness of breath and wheezing.  Was seen in the ED last week and states since that time he has not been able to get steroid prescription filled- he assumed it would be expensive.  He also states he lost the albuterol inhaler.  He c/o mild wheezing. No fever, some cough.  No chest pain or legs swelling.  There are no other associated systemic symptoms, there are no other alleviating or modifying factors.   Past Medical History:  Diagnosis Date  . Asthma   . Chronic back pain   . COPD (chronic obstructive pulmonary disease) (HCC)     There are no active problems to display for this patient.   Past Surgical History:  Procedure Laterality Date  . APPENDECTOMY         Home Medications    Prior to Admission medications   Medication Sig Start Date End Date Taking? Authorizing Provider  albuterol (PROVENTIL HFA;VENTOLIN HFA) 108 (90 Base) MCG/ACT inhaler Inhale 2 puffs into the lungs every 6 (six) hours as needed for wheezing or shortness of breath.    [provider]  albuterol (PROVENTIL HFA;VENTOLIN HFA) 108 (90 Base) MCG/ACT inhaler Inhale 2 puffs into the lungs every 4 (four) hours as needed for wheezing or shortness of breath. 06/14/17   Ward, Layla Maw, DO  predniSONE (DELTASONE) 50 MG tablet Take 1 tablet (50 mg total) by mouth daily. 06/21/17   Jerelyn Scott, MD    Family History No family history on file.  Social History Social History  Substance Use Topics  . Smoking status: Current Every Day Smoker    Packs/day: 0.50    Types: Cigarettes  . Smokeless tobacco: Never Used  . Alcohol use Yes     Comment: denies current use     Allergies   Patient has no known  allergies.   Review of Systems Review of Systems  ROS reviewed and all otherwise negative except for mentioned in HPI   Physical Exam Updated Vital Signs BP 114/71 (BP Location: Right Arm)   Pulse 78   Temp 98.2 F (36.8 C) (Oral)   Resp 18   Ht 5\' 5"  (1.651 m)   Wt 81.6 kg (180 lb)   SpO2 97%   BMI 29.95 kg/m  Vitals reviewed Physical Exam Physical Examination: General appearance - alert, well appearing, and in no distress Mental status - alert, oriented to person, place, and time Eyes -no conjunctival injection, no scleral icterus Mouth - mucous membranes moist, pharynx normal without lesions Neck - supple, no significant adenopathy Chest - BSS, mild bilateral wheezing, normal respiratory effort Heart - normal rate, regular rhythm, normal S1, S2, no murmurs, rubs, clicks or gallops Abdomen - soft, nontender, nondistended, no masses or organomegaly Neurological - alert, oriented, normal speech Extremities - peripheral pulses normal, no pedal edema, no clubbing or cyanosis Skin - normal coloration and turgor, no rashes  ED Treatments / Results  Labs (all labs ordered are listed, but only abnormal results are displayed) Labs Reviewed - No data to display  EKG  EKG Interpretation None       Radiology No results found.  Procedures Procedures (including critical care  time)  Medications Ordered in ED Medications  albuterol (PROVENTIL) (2.5 MG/3ML) 0.083% nebulizer solution 5 mg (5 mg Nebulization Given 06/21/17 0846)  ipratropium (ATROVENT) nebulizer solution 0.5 mg (0.5 mg Nebulization Given 06/21/17 0846)  predniSONE (DELTASONE) tablet 60 mg (60 mg Oral Given 06/21/17 0902)     Initial Impression / Assessment and Plan / ED Course  I have reviewed the triage vital signs and the nursing notes.  Pertinent labs & imaging results that were available during my care of the patient were reviewed by me and considered in my medical decision making (see chart for  details).     Pt presenting due to wheezing- he was seen in the ED last week, given albuterol inhaler which he has lost and rx for steroids which he has not had filled due to concern about pricing.  Pt given albuterol MDI in the ED.  Given first dose of prednisone- pt states he wil be able to get prednisone rx filled tomorrow.  Discharged with strict return precautions.  Pt agreeable with plan.  Final Clinical Impressions(s) / ED Diagnoses   Final diagnoses:  COPD exacerbation [facility](HCC)    New Prescriptions Discharge Medication List as of 06/21/2017  9:29 AM       Jerelyn ScottLinker, Kaylei Frink, MD 06/25/17 [phone]

## 2017-06-21 NOTE — Discharge Instructions (Signed)
Return to the ED with any concerns including difficulty breathing despite using albuterol every 4 hours, not drinking fluids, decreased urine output, vomiting and not able to keep down liquids or medications, decreased level of alertness/lethargy, or any other alarming symptoms °

## 2017-06-26 ENCOUNTER — Encounter (HOSPITAL_BASED_OUTPATIENT_CLINIC_OR_DEPARTMENT_OTHER): Payer: Self-pay | Admitting: *Deleted

## 2017-06-26 ENCOUNTER — Emergency Department (HOSPITAL_BASED_OUTPATIENT_CLINIC_OR_DEPARTMENT_OTHER): Payer: Self-pay

## 2017-06-26 ENCOUNTER — Emergency Department (HOSPITAL_BASED_OUTPATIENT_CLINIC_OR_DEPARTMENT_OTHER)
Admission: EM | Admit: 2017-06-26 | Discharge: 2017-06-26 | Disposition: A | Discharge status: Home / Self Care | Payer: Self-pay | Attending: Emergency Medicine | Admitting: Emergency Medicine

## 2017-06-26 DIAGNOSIS — J40 Bronchitis, not specified as acute or chronic: Secondary | ICD-10-CM

## 2017-06-26 DIAGNOSIS — J449 Chronic obstructive pulmonary disease, unspecified: Secondary | ICD-10-CM | POA: Insufficient documentation

## 2017-06-26 DIAGNOSIS — J45909 Unspecified asthma, uncomplicated: Secondary | ICD-10-CM | POA: Insufficient documentation

## 2017-06-26 DIAGNOSIS — J209 Acute bronchitis, unspecified: Secondary | ICD-10-CM | POA: Insufficient documentation

## 2017-06-26 DIAGNOSIS — R42 Dizziness and giddiness: Secondary | ICD-10-CM

## 2017-06-26 DIAGNOSIS — F17210 Nicotine dependence, cigarettes, uncomplicated: Secondary | ICD-10-CM | POA: Insufficient documentation

## 2017-06-26 HISTORY — DX: Dizziness and giddiness: R42

## 2017-06-26 LAB — COMPREHENSIVE METABOLIC PANEL
ALT: 11 U/L — ABNORMAL LOW (ref 17–63)
AST: 12 U/L — ABNORMAL LOW (ref 15–41)
Albumin: 3.9 g/dL (ref 3.5–5.0)
Alkaline Phosphatase: 63 U/L (ref 38–126)
Anion gap: 8 (ref 5–15)
BUN: 16 mg/dL (ref 6–20)
CO2: 28 mmol/L (ref 22–32)
Calcium: 8.9 mg/dL (ref 8.9–10.3)
Chloride: 103 mmol/L (ref 101–111)
Creatinine, Ser: 0.94 mg/dL (ref 0.61–1.24)
GFR calc Af Amer: >60 mL/min (ref >60)
GFR calc non Af Amer: >60 mL/min (ref >60)
Glucose, Bld: 114 mg/dL — ABNORMAL HIGH (ref 65–99)
Potassium: 4.1 mmol/L (ref 3.5–5.1)
Sodium: 139 mmol/L (ref 135–145)
Total Bilirubin: 0.3 mg/dL (ref 0.3–1.2)
Total Protein: 7.1 g/dL (ref 6.5–8.1)

## 2017-06-26 LAB — CBC WITH DIFFERENTIAL/PLATELET
Basophils Absolute: 0 10*3/uL (ref 0.0–0.1)
Basophils Relative: 0 %
Eosinophils Absolute: 1.2 10*3/uL — ABNORMAL HIGH (ref 0.0–0.7)
Eosinophils Relative: 10 %
HCT: 46.8 % (ref 39.0–52.0)
Hemoglobin: 15.7 g/dL (ref 13.0–17.0)
Lymphocytes Relative: 13 %
Lymphs Abs: 1.6 10*3/uL (ref 0.7–4.0)
MCH: 30.2 pg (ref 26.0–34.0)
MCHC: 33.5 g/dL (ref 30.0–36.0)
MCV: 90 fL (ref 78.0–100.0)
Monocytes Absolute: 0.6 10*3/uL (ref 0.1–1.0)
Monocytes Relative: 5 %
Neutro Abs: 8.7 10*3/uL — ABNORMAL HIGH (ref 1.7–7.7)
Neutrophils Relative %: 72 %
Platelets: 335 10*3/uL (ref 150–400)
RBC: 5.2 MIL/uL (ref 4.22–5.81)
RDW: 13.4 % (ref 11.5–15.5)
WBC: 12.1 10*3/uL — ABNORMAL HIGH (ref 4.0–10.5)

## 2017-06-26 LAB — ETHANOL: Alcohol, Ethyl (B): <5 mg/dL (ref <5)

## 2017-06-26 LAB — LIPASE, BLOOD: Lipase: 20 U/L (ref 11–51)

## 2017-06-26 MED ORDER — ONDANSETRON HCL 4 MG/2ML IJ SOLN
4.0000 mg | Freq: Once | INTRAMUSCULAR | Status: AC
  Administered 2017-06-26: 4 mg via INTRAVENOUS
  Filled 2017-06-26: qty 2

## 2017-06-26 MED ORDER — MECLIZINE HCL 25 MG PO TABS
25.0000 mg | ORAL_TABLET | Freq: Once | ORAL | Status: AC
  Administered 2017-06-26: 25 mg via ORAL
  Filled 2017-06-26: qty 1

## 2017-06-26 MED ORDER — ALBUTEROL SULFATE HFA 108 (90 BASE) MCG/ACT IN AERS: 1.0000 | INHALATION_SPRAY | Freq: Four times a day (QID) | RESPIRATORY_TRACT | 0 refills | Status: DC | PRN

## 2017-06-26 MED ORDER — SODIUM CHLORIDE 0.9 % IV SOLN
INTRAVENOUS | Status: DC
  Administered 2017-06-26: 15:00:00 via INTRAVENOUS

## 2017-06-26 MED ORDER — SODIUM CHLORIDE 0.9 % IV BOLUS (SEPSIS)
1000.0000 mL | Freq: Once | INTRAVENOUS | Status: AC
  Administered 2017-06-26: 1000 mL via INTRAVENOUS

## 2017-06-26 MED ORDER — MECLIZINE HCL 25 MG PO TABS: 25.0000 mg | ORAL_TABLET | Freq: Three times a day (TID) | ORAL | 0 refills | Status: DC | PRN

## 2017-06-26 NOTE — ED Provider Notes (Addendum)
[facility]   CSN: [id number] Arrival date & time: 06/26/17  1201     History   Chief Complaint Chief Complaint  Patient presents with  . Dizziness    HPI Robert Swanson is a 51 y.o. male.  Patient presents today with a complaint of dizziness no true room spinning. Cough occasionally productive. Some chest discomfort with the cough. And nausea and diaphoresis. This is patient's seventh visit since May 20 to this facility. Normally seen for COPD exacerbation. Patient does not feel as if he is wheezing today.      Past Medical History:  Diagnosis Date  . Asthma   . Chronic back pain   . COPD (chronic obstructive pulmonary disease) (HCC)   . Vertigo     There are no active problems to display for this patient.   Past Surgical History:  Procedure Laterality Date  . APPENDECTOMY         Home Medications    Prior to Admission medications   Medication Sig Start Date End Date Taking? Authorizing Provider  albuterol (PROVENTIL HFA;VENTOLIN HFA) 108 (90 Base) MCG/ACT inhaler Inhale 2 puffs into the lungs every 6 (six) hours as needed for wheezing or shortness of breath.    [provider]  albuterol (PROVENTIL HFA;VENTOLIN HFA) 108 (90 Base) MCG/ACT inhaler Inhale 2 puffs into the lungs every 4 (four) hours as needed for wheezing or shortness of breath. 06/14/17   Ward, Layla MawKristen N, DO  meclizine (ANTIVERT) 25 MG tablet Take 1 tablet (25 mg total) by mouth 3 (three) times daily as needed for dizziness. 06/26/17   Vanetta MuldersZackowski, Breta Demedeiros, MD  predniSONE (DELTASONE) 50 MG tablet Take 1 tablet (50 mg total) by mouth daily. 06/21/17   Jerelyn ScottLinker, Martha, MD    Family History No family history on file.  Social History Social History  Substance Use Topics  . Smoking status: Current Every Day Smoker    Packs/day: 0.50    Types: Cigarettes  . Smokeless tobacco: Never Used  . Alcohol use Yes     Comment: denies current use     Allergies   Patient  has no known allergies.   Review of Systems Review of Systems  Constitutional: Positive for diaphoresis and fatigue. Negative for fever.  HENT: Negative for congestion.   Eyes: Negative for visual disturbance.  Respiratory: Positive for cough. Negative for shortness of breath.   Cardiovascular: Negative for chest pain.  Gastrointestinal: Positive for nausea. Negative for abdominal pain.  Genitourinary: Negative for dysuria.  Musculoskeletal: Negative for back pain and myalgias.  Skin: Negative for rash.  Neurological: Positive for dizziness. Negative for syncope and headaches.  Hematological: Does not bruise/bleed easily.  Psychiatric/Behavioral: Negative for confusion.     Physical Exam Updated Vital Signs BP 108/71   Pulse 63   Temp 98 F (36.7 C) (Oral)   Resp 18   Ht 1.651 m (5\' 5" )   Wt 81.6 kg (180 lb)   SpO2 93%   BMI 29.95 kg/m   Physical Exam  Constitutional: He is oriented to person, place, and time. He appears well-developed and well-nourished. No distress.  HENT:  Head: Normocephalic and atraumatic.  Mouth/Throat: Oropharynx is clear and moist.  Eyes: Pupils are equal, round, and reactive to light. Conjunctivae and EOM are normal.  Neck: Normal range of motion. Neck supple.  Cardiovascular: Normal rate, regular rhythm and normal heart sounds.   Pulmonary/Chest: Effort normal and breath sounds normal. No respiratory distress. He has no wheezes.  Abdominal: Soft. Bowel sounds are normal. There is no tenderness.  Musculoskeletal: Normal range of motion. He exhibits no edema.  Neurological: He is alert and oriented to person, place, and time. No cranial nerve deficit or sensory deficit. He exhibits normal muscle tone. Coordination normal.  Skin: Skin is warm.  Nursing note and vitals reviewed.    ED Treatments / Results  Labs (all labs ordered are listed, but only abnormal results are displayed) Labs Reviewed  COMPREHENSIVE METABOLIC PANEL - Abnormal;  Notable for the following:       Result Value   Glucose, Bld 114 (*)    AST 12 (*)    ALT 11 (*)    All other components within normal limits  CBC WITH DIFFERENTIAL/PLATELET - Abnormal; Notable for the following:    WBC 12.1 (*)    Neutro Abs 8.7 (*)    Eosinophils Absolute 1.2 (*)    All other components within normal limits  LIPASE, BLOOD  ETHANOL    EKG  EKG Interpretation  Date/Time:  Friday June 26 2017 12:14:08 EDT Ventricular Rate:  76 PR Interval:    QRS Duration: 108 QT Interval:  377 QTC Calculation: 424 R Axis:   -143 Text Interpretation:  Sinus rhythm Right axis deviation Borderline low voltage, extremity leads Baseline wander in lead(s) V3 No significant change since last tracing Confirmed by Vanetta Mulders [phone]) on 06/26/2017 12:31:46 PM       Radiology Dg Chest 2 View  Result Date: 06/26/2017 CLINICAL DATA:  Dizziness today.  nausea. EXAM: CHEST  2 VIEW COMPARISON:  05/23/2017 FINDINGS: Artifact overlies chest. Heart size is normal. Mediastinal shadows are normal. Lungs are clear. The vascularity is normal. No effusions. No bone abnormality. IMPRESSION: Normal chest Electronically Signed   By: Paulina Fusi M.D.   On: 06/26/2017 13:15   Ct Head Wo Contrast  Result Date: 06/26/2017 CLINICAL DATA:  Dizziness. EXAM: CT HEAD WITHOUT CONTRAST TECHNIQUE: Contiguous axial images were obtained from the base of the skull through the vertex without intravenous contrast. COMPARISON:  None. FINDINGS: Brain: No evidence of acute infarction, hemorrhage, hydrocephalus, extra-axial collection or mass lesion/mass effect. Vascular: No hyperdense vessel or unexpected calcification. Skull: Normal. Negative for fracture or focal lesion. Sinuses/Orbits: No acute finding. Other: None. IMPRESSION: Normal head CT. Electronically Signed   By: Lupita Raider, M.D.   On: 06/26/2017 13:21    Procedures Procedures (including critical care time)  Medications Ordered in ED Medications    0.9 %  sodium chloride infusion ( Intravenous New Bag/Given 06/26/17 1443)  meclizine (ANTIVERT) tablet 25 mg (not administered)  sodium chloride 0.9 % bolus 1,000 mL (0 mLs Intravenous Stopped 06/26/17 1402)  ondansetron (ZOFRAN) injection 4 mg (4 mg Intravenous Given 06/26/17 1312)     Initial Impression / Assessment and Plan / ED Course  I have reviewed the triage vital signs and the nursing notes.  Pertinent labs & imaging results that were available during my care of the patient were reviewed by me and considered in my medical decision making (see chart for details).     Workup for the nausea dizziness and cough without any acute findings. CT head negative chest x-ray negative. Symptoms improved with antinausea medicine fluids and meclizine. No true vertigo. However will refer patient for additional follow-up with neurology. Patient's been seen multiple times for COPD exacerbation no wheezing today. No evidence of any pneumonia.   At discharge patient requested albuterol inhaler. Patient's had 2 other previous prescription provided here  for albuterol. He claims that he's losing them. Patient does have a known history of COPD.  We'll renew and provide a prescription for albuterol as well.   Final Clinical Impressions(s) / ED Diagnoses   Final diagnoses:  Dizziness  Bronchitis    New Prescriptions New Prescriptions   MECLIZINE (ANTIVERT) 25 MG TABLET    Take 1 tablet (25 mg total) by mouth 3 (three) times daily as needed for dizziness.     Vanetta Mulders, MD 06/26/17 1452    Vanetta Mulders, MD 06/26/17 [phone]

## 2017-06-26 NOTE — ED Notes (Signed)
ED Provider at bedside. 

## 2017-06-26 NOTE — ED Triage Notes (Signed)
Woke this am with dizziness. Chest pain.

## 2017-06-26 NOTE — Discharge Instructions (Signed)
Take the meclizine for the dizziness. Make appointment to follow-up with neurology. Chest x-ray showed no evidence of pneumonia. Return for any new or worse symptoms.

## 2017-07-28 ENCOUNTER — Emergency Department (HOSPITAL_BASED_OUTPATIENT_CLINIC_OR_DEPARTMENT_OTHER)
Admission: EM | Admit: 2017-07-28 | Discharge: 2017-07-28 | Disposition: A | Discharge status: Home / Self Care | Payer: Self-pay | Attending: Emergency Medicine | Admitting: Emergency Medicine

## 2017-07-28 ENCOUNTER — Encounter (HOSPITAL_BASED_OUTPATIENT_CLINIC_OR_DEPARTMENT_OTHER): Payer: Self-pay | Admitting: *Deleted

## 2017-07-28 DIAGNOSIS — J45909 Unspecified asthma, uncomplicated: Secondary | ICD-10-CM | POA: Insufficient documentation

## 2017-07-28 DIAGNOSIS — Z79899 Other long term (current) drug therapy: Secondary | ICD-10-CM | POA: Insufficient documentation

## 2017-07-28 DIAGNOSIS — L03211 Cellulitis of face: Secondary | ICD-10-CM

## 2017-07-28 DIAGNOSIS — J449 Chronic obstructive pulmonary disease, unspecified: Secondary | ICD-10-CM | POA: Insufficient documentation

## 2017-07-28 DIAGNOSIS — F17210 Nicotine dependence, cigarettes, uncomplicated: Secondary | ICD-10-CM | POA: Insufficient documentation

## 2017-07-28 MED ORDER — IBUPROFEN 800 MG PO TABS
800.0000 mg | ORAL_TABLET | Freq: Once | ORAL | Status: AC
  Administered 2017-07-28: 800 mg via ORAL
  Filled 2017-07-28: qty 1

## 2017-07-28 MED ORDER — SULFAMETHOXAZOLE-TRIMETHOPRIM 800-160 MG PO TABS: 1.0000 | ORAL_TABLET | Freq: Two times a day (BID) | ORAL | 0 refills | Status: DC

## 2017-07-28 MED ORDER — SULFAMETHOXAZOLE-TRIMETHOPRIM 800-160 MG PO TABS
1.0000 | ORAL_TABLET | Freq: Once | ORAL | Status: AC
  Administered 2017-07-28: 1 via ORAL
  Filled 2017-07-28: qty 1

## 2017-07-28 NOTE — ED Provider Notes (Signed)
[facility]   CSN: [id number] Arrival date & time: 07/28/17  1619     History   Chief Complaint Chief Complaint  Patient presents with  . Abscess    HPI Robert Swanson is a 51 y.o. male who presents to the emergency department today for facial swelling. The patient notes that he had what he reports as a "pimple" on his left nare of his nose that is been trying to pop over the last several days. Over the last 2 days he has had increased swelling on the left side of his face from the left nare of his nose to his left upper lip. The patient is in control of secretions. No respiratory distress. No difficulty breathing. No fevers at home. This is a new problem. No other rash. No use of ACE-I, no recent medication changes.    HPI  Past Medical History:  Diagnosis Date  . Asthma   . Chronic back pain   . COPD (chronic obstructive pulmonary disease) (HCC)   . Vertigo     There are no active problems to display for this patient.   Past Surgical History:  Procedure Laterality Date  . APPENDECTOMY         Home Medications    Prior to Admission medications   Medication Sig Start Date End Date Taking? Authorizing Provider  albuterol (PROVENTIL HFA;VENTOLIN HFA) 108 (90 Base) MCG/ACT inhaler Inhale 2 puffs into the lungs every 6 (six) hours as needed for wheezing or shortness of breath.   Yes [provider]  albuterol (PROVENTIL HFA;VENTOLIN HFA) 108 (90 Base) MCG/ACT inhaler Inhale 2 puffs into the lungs every 4 (four) hours as needed for wheezing or shortness of breath. 06/14/17   Ward, Layla Maw, DO  albuterol (PROVENTIL HFA;VENTOLIN HFA) 108 (90 Base) MCG/ACT inhaler Inhale 1-2 puffs into the lungs every 6 (six) hours as needed for wheezing or shortness of breath. 06/26/17   Vanetta Mulders, MD  meclizine (ANTIVERT) 25 MG tablet Take 1 tablet (25 mg total) by mouth 3 (three) times daily as needed for dizziness. 06/26/17   Vanetta Mulders, MD    predniSONE (DELTASONE) 50 MG tablet Take 1 tablet (50 mg total) by mouth daily. 06/21/17   Mabe, Latanya Maudlin, MD  sulfamethoxazole-trimethoprim (BACTRIM DS,SEPTRA DS) 800-160 MG tablet Take 1 tablet by mouth 2 (two) times daily. 07/28/17   Shaylie Eklund, Elmer Sow, PA-C    Family History No family history on file.  Social History Social History  Substance Use Topics  . Smoking status: Current Every Day Smoker    Packs/day: 0.50    Types: Cigarettes  . Smokeless tobacco: Never Used  . Alcohol use Yes     Comment: denies current use     Allergies   Patient has no known allergies.   Review of Systems Review of Systems  All other systems reviewed and are negative.    Physical Exam Updated Vital Signs BP 128/65 (BP Location: Right Arm)   Pulse 79   Temp (!) 97.5 F (36.4 C) (Oral)   Resp 18   Ht 5\' 4"  (1.626 m)   Wt 81.6 kg (180 lb)   SpO2 96%   BMI 30.90 kg/m   Physical Exam  Constitutional: He appears well-developed and well-nourished.  HENT:  Head: Normocephalic and atraumatic.  Right Ear: Hearing, tympanic membrane, external ear and ear canal normal.  Left Ear: Hearing, tympanic membrane, external ear and ear canal normal.  Mouth/Throat: Oropharynx is clear and moist.  The patient has normal phonation and is in control of secretions. No stridor.  Midline uvula. No tonsillar erythema or exudates. No creptius on neck palpation and patient has good dentition. No gingival erythema or fluctuance noted. No sloughing of oral mucosa.   Eyes: Pupils are equal, round, and reactive to light. Right eye exhibits no discharge. Left eye exhibits no discharge. No scleral icterus.  Neck: Neck supple.  Cardiovascular: Normal rate, regular rhythm and intact distal pulses.   No murmur heard. Pulses:      Radial pulses are 2+ on the right side, and 2+ on the left side.       Dorsalis pedis pulses are 2+ on the right side, and 2+ on the left side.       Posterior tibial pulses are 2+ on the  right side, and 2+ on the left side.  No lower extremity swelling or edema. Calves symmetric in size bilaterally.  Pulmonary/Chest: Effort normal and breath sounds normal. He exhibits no tenderness.  Abdominal: Soft. Bowel sounds are normal. There is no tenderness. There is no rebound and no guarding.  Musculoskeletal: He exhibits no edema.  Lymphadenopathy:    He has no cervical adenopathy.  Neurological: He is alert.  Skin: Skin is warm, dry and intact. Capillary refill takes less than 2 seconds. No rash noted. He is not diaphoretic.  There is noted swelling, erythema and heat on the left side of the face from the end of the left nare to the left lip. No fluctuance or induration noted.   Psychiatric: He has a normal mood and affect.  Nursing note and vitals reviewed.    ED Treatments / Results  Labs (all labs ordered are listed, but only abnormal results are displayed) Labs Reviewed - No data to display  EKG  EKG Interpretation None       Radiology No results found.  Procedures Procedures (including critical care time)  Medications Ordered in ED Medications  ibuprofen (ADVIL,MOTRIN) tablet 800 mg (800 mg Oral Given 07/28/17 1753)  sulfamethoxazole-trimethoprim (BACTRIM DS,SEPTRA DS) 800-160 MG per tablet 1 tablet (1 tablet Oral Given 07/28/17 1753)     Initial Impression / Assessment and Plan / ED Course  I have reviewed the triage vital signs and the nursing notes.  Pertinent labs & imaging results that were available during my care of the patient were reviewed by me and considered in my medical decision making (see chart for details).     51 year old male with erythema, heat, and minimal swelling of the face from the end of the left nare to start of the left lip consistent with cellulitis. Patient is without risk factors for HIV. No recent steroid use or other immunosuppressive medications. There does not appear to be gross abscess for which I&D would be possible. The  patient has normal phonation and is in control of secretions. No creptius on neck palpation and patient has good dentition, do not think this is ludwigs. Patient able to drink water in the ED without difficulty with an intact airway. First dose of antibiotics given in the department. Pain managed in the department. . I advised the patient to follow-up with PCP this week. I advised the patient to return to the emergency department with new or worsening symptoms or new concerns. Specific return precautions discussed including further spreading/streaking of the area of erythema despite antibiotic use, development of fever or N/V, if the swelling is to extend further to the lips or cause any difficulty  with breathing. The patient verbalized understanding and agreement with plan. All questions answered. No further questions at this time. The patient is hemodynamically stable, mentating appropriately and appears safe for discharge.   Final Clinical Impressions(s) / ED Diagnoses   Final diagnoses:  Cellulitis of face    New Prescriptions Discharge Medication List as of 07/28/2017  6:02 PM    START taking these medications   Details  sulfamethoxazole-trimethoprim (BACTRIM DS,SEPTRA DS) 800-160 MG tablet Take 1 tablet by mouth 2 (two) times daily., Starting Tue 07/28/2017, Print         Jacinto HalimMaczis, Aqueelah Cotrell M, PA-C 07/29/17 [id number]    Geoffery Lyonselo, Douglas, MD 07/30/17 [phone]

## 2017-07-28 NOTE — ED Notes (Signed)
Patient is resting comfortably, with eyes closed and snoring 

## 2017-07-28 NOTE — Discharge Instructions (Signed)
Please take all of your antibiotics until finished!   You may develop abdominal discomfort or diarrhea from the antibiotic.  You may help offset this with probiotics which you can buy or get in yogurt. Do not eat  or take the probiotics until 2 hours after your antibiotic.   Follow up with your PCP this week for recheck. If you develop worsening or new concerning symptoms you can return to the emergency department for re-evaluation. See below for additional return precautions.   You have a skin infection. The infected area is usually red and sore. It is very important to get treated for this condition. Follow these instructions at home: Take over-the-counter and prescription medicines only as told by your doctor. If you were prescribed an antibiotic medicine, take it as told by your doctor. Do not stop taking the antibiotic even if you start to feel better. Drink enough fluid to keep your pee (urine) clear or pale yellow. Do not touch or rub the infected area. Raise (elevate) the infected area above the level of your heart while you are sitting or lying down. Place warm or cold wet cloths (warm or cold compresses) on the infected area. Do this as told by your doctor. Keep all follow-up visits as told by your doctor. This is important. These visits let your doctor make sure your infection is not getting worse. Contact a doctor if: You have a fever. Your symptoms do not get better after 1-2 days of treatment. Your bone or joint under the infected area starts to hurt after the skin has healed. Your infection comes back. This can happen in the same area or another area. You have a swollen bump in the infected area. You have new symptoms. You feel ill and also have muscle aches and pains. Get help right away if: Your symptoms get worse. You feel very sleepy. You throw up (vomit) or have watery poop (diarrhea) for a long time. There are red streaks coming from the infected area. Your red area  gets larger. Your red area turns darker.

## 2017-07-28 NOTE — ED Triage Notes (Signed)
States he has been picking at a pimple in his nose and now his nose and face are swollen.

## 2017-07-31 ENCOUNTER — Encounter (HOSPITAL_BASED_OUTPATIENT_CLINIC_OR_DEPARTMENT_OTHER): Payer: Self-pay

## 2017-07-31 ENCOUNTER — Emergency Department (HOSPITAL_BASED_OUTPATIENT_CLINIC_OR_DEPARTMENT_OTHER)
Admission: EM | Admit: 2017-07-31 | Discharge: 2017-07-31 | Disposition: A | Discharge status: Home / Self Care | Payer: Self-pay | Attending: Emergency Medicine | Admitting: Emergency Medicine

## 2017-07-31 DIAGNOSIS — Z79899 Other long term (current) drug therapy: Secondary | ICD-10-CM | POA: Insufficient documentation

## 2017-07-31 DIAGNOSIS — J449 Chronic obstructive pulmonary disease, unspecified: Secondary | ICD-10-CM | POA: Insufficient documentation

## 2017-07-31 DIAGNOSIS — F17210 Nicotine dependence, cigarettes, uncomplicated: Secondary | ICD-10-CM | POA: Insufficient documentation

## 2017-07-31 DIAGNOSIS — G8929 Other chronic pain: Secondary | ICD-10-CM | POA: Insufficient documentation

## 2017-07-31 DIAGNOSIS — K130 Diseases of lips: Secondary | ICD-10-CM | POA: Insufficient documentation

## 2017-07-31 DIAGNOSIS — K029 Dental caries, unspecified: Secondary | ICD-10-CM | POA: Insufficient documentation

## 2017-07-31 DIAGNOSIS — J45909 Unspecified asthma, uncomplicated: Secondary | ICD-10-CM | POA: Insufficient documentation

## 2017-07-31 DIAGNOSIS — F1410 Cocaine abuse, uncomplicated: Secondary | ICD-10-CM

## 2017-07-31 LAB — BASIC METABOLIC PANEL
Anion gap: 9 (ref 5–15)
BUN: 12 mg/dL (ref 6–20)
CO2: 28 mmol/L (ref 22–32)
Calcium: 8.2 mg/dL — ABNORMAL LOW (ref 8.9–10.3)
Chloride: 99 mmol/L — ABNORMAL LOW (ref 101–111)
Creatinine, Ser: 0.9 mg/dL (ref 0.61–1.24)
GFR calc Af Amer: >60 mL/min (ref >60)
GFR calc non Af Amer: >60 mL/min (ref >60)
Glucose, Bld: 112 mg/dL — ABNORMAL HIGH (ref 65–99)
Potassium: 3.3 mmol/L — ABNORMAL LOW (ref 3.5–5.1)
Sodium: 136 mmol/L (ref 135–145)

## 2017-07-31 LAB — CBC WITH DIFFERENTIAL/PLATELET
Basophils Absolute: 0.1 10*3/uL (ref 0.0–0.1)
Basophils Relative: 0 %
Eosinophils Absolute: 1.1 10*3/uL — ABNORMAL HIGH (ref 0.0–0.7)
Eosinophils Relative: 8 %
HCT: 39.8 % (ref 39.0–52.0)
Hemoglobin: 13.2 g/dL (ref 13.0–17.0)
Lymphocytes Relative: 15 %
Lymphs Abs: 2.2 10*3/uL (ref 0.7–4.0)
MCH: 29.3 pg (ref 26.0–34.0)
MCHC: 33.2 g/dL (ref 30.0–36.0)
MCV: 88.4 fL (ref 78.0–100.0)
Monocytes Absolute: 0.9 10*3/uL (ref 0.1–1.0)
Monocytes Relative: 7 %
Neutro Abs: 10 10*3/uL — ABNORMAL HIGH (ref 1.7–7.7)
Neutrophils Relative %: 70 %
Platelets: 312 10*3/uL (ref 150–400)
RBC: 4.5 MIL/uL (ref 4.22–5.81)
RDW: 13.7 % (ref 11.5–15.5)
WBC: 14.3 10*3/uL — ABNORMAL HIGH (ref 4.0–10.5)

## 2017-07-31 LAB — RAPID URINE DRUG SCREEN, HOSP PERFORMED
Amphetamines: NOT DETECTED
Barbiturates: NOT DETECTED
Benzodiazepines: NOT DETECTED
Cocaine: POSITIVE — AB
Opiates: NOT DETECTED
Tetrahydrocannabinol: NOT DETECTED

## 2017-07-31 LAB — ETHANOL: Alcohol, Ethyl (B): <5 mg/dL (ref <5)

## 2017-07-31 MED ORDER — CLINDAMYCIN PHOSPHATE 600 MG/50ML IV SOLN
600.0000 mg | Freq: Once | INTRAVENOUS | Status: AC
  Administered 2017-07-31: 600 mg via INTRAVENOUS
  Filled 2017-07-31: qty 50

## 2017-07-31 MED ORDER — CLINDAMYCIN HCL 150 MG PO CAPS: 300.0000 mg | ORAL_CAPSULE | Freq: Four times a day (QID) | ORAL | 0 refills | Status: DC

## 2017-07-31 NOTE — ED Notes (Signed)
Patient reports that he snorted something that a friend gave him, also drank some beers earlier today. Patient is hard to wake up, and is not very forthcoming with information when he is awake. Patient placed on o2 monitor and Heart monitor related to Altered Mental Status. Patient is having uncontrolled twitching at times and unable to hold still when commanded to do so. Patient has audible stridor at times and at times Snoring loudly while sleeping.

## 2017-07-31 NOTE — ED Provider Notes (Addendum)
[facility] Provider Note: Lowella Dell, MD, FACEP  CSN: [id number] MRN: [id number] ARRIVAL: 07/31/17 at 0012 ROOM: [facility]   CHIEF COMPLAINT  Oral Pain   HISTORY OF PRESENT ILLNESS  07/31/17 5:27 AM Robert Swanson is a 51 y.o. male with about a one-week history of pain and swelling in his left nostril; it began as a crusted lesion in his left nostril. He was seen on the 14th of this month and diagnosed with cellulitis of the face. He was placed on Bactrim. Despite this his symptoms have worsened. He now has pain and swelling of his upper lip with drainage of purulent material. Pain is moderate to severe especially with movement or palpation. He was noted to be somnolent with slurred speech on arrival, and has been sleeping since being placed in a room. He admits to snorting "something" that a friend gave him.    Past Medical History:  Diagnosis Date  . Asthma   . Chronic back pain   . COPD (chronic obstructive pulmonary disease) (HCC)   . Vertigo     Past Surgical History:  Procedure Laterality Date  . APPENDECTOMY      No family history on file.  Social History  Substance Use Topics  . Smoking status: Current Every Day Smoker    Packs/day: 0.50    Types: Cigarettes  . Smokeless tobacco: Never Used  . Alcohol use Yes     Comment: denies current use    Prior to Admission medications   Medication Sig Start Date End Date Taking? Authorizing Provider  albuterol (PROVENTIL HFA;VENTOLIN HFA) 108 (90 Base) MCG/ACT inhaler Inhale 1-2 puffs into the lungs every 6 (six) hours as needed for wheezing or shortness of breath. 06/26/17   Vanetta Mulders, MD  clindamycin (CLEOCIN) 150 MG capsule Take 2 capsules (300 mg total) by mouth 4 (four) times daily. 07/31/17   Samary Shatz, Jujuan, MD  meclizine (ANTIVERT) 25 MG tablet Take 1 tablet (25 mg total) by mouth 3 (three) times daily as needed for dizziness. 06/26/17   Vanetta Mulders, MD  predniSONE (DELTASONE) 50 MG tablet  Take 1 tablet (50 mg total) by mouth daily. 06/21/17   MabeLatanya Maudlin, MD    Allergies Amoxicillin   REVIEW OF SYSTEMS  Negative except as noted here or in the History of Present Illness.   PHYSICAL EXAMINATION  Initial Vital Signs Blood pressure 140/69, pulse 82, temperature 99 F (37.2 C), temperature source Oral, resp. rate 19, height 5\' 4"  (1.626 m), weight 81.6 kg (180 lb), SpO2 99 %.  Examination General: Well-developed, well-nourished male in no acute distress; appearance consistent with age of record HENT: normocephalic; atraumatic; crusted, tender lesion of left nostril; edema and tenderness of upper lip with draining abscess near frenulum of upper lip; dental caries Eyes: pupils equal, round and reactive to light; extraocular muscles intact Neck: supple; no lymphadenopathy Heart: regular rate and rhythm Lungs: faint expiratory wheezing Abdomen: soft; nondistended; nontender; bowel sounds present Extremities: No deformity; full range of motion; pulses normal Neurologic: Somnolent but arousable; slurred speech; motor function intact in all extremities and symmetric; no facial droop Skin: Warm and dry Psychiatric: Flat affect   RESULTS  Summary of this visit's results, reviewed by myself:   EKG Interpretation  Date/Time:    Ventricular Rate:    PR Interval:    QRS Duration:   QT Interval:    QTC Calculation:   R Axis:     Text Interpretation:  Laboratory Studies: Results for orders placed or performed during the hospital encounter of 07/31/17 (from the past 24 hour(s))  CBC with Differential     Status: Abnormal   Collection Time: 07/31/17  1:03 AM  Result Value Ref Range   WBC 14.3 (H) 4.0 - 10.5 K/uL   RBC 4.50 4.22 - 5.81 MIL/uL   Hemoglobin 13.2 13.0 - 17.0 g/dL   HCT 16.1 09.6 - 04.5 %   MCV 88.4 78.0 - 100.0 fL   MCH 29.3 26.0 - 34.0 pg   MCHC 33.2 30.0 - 36.0 g/dL   RDW 40.9 81.1 - 91.4 %   Platelets 312 150 - 400 K/uL   Neutrophils  Relative % 70 %   Neutro Abs 10.0 (H) 1.7 - 7.7 K/uL   Lymphocytes Relative 15 %   Lymphs Abs 2.2 0.7 - 4.0 K/uL   Monocytes Relative 7 %   Monocytes Absolute 0.9 0.1 - 1.0 K/uL   Eosinophils Relative 8 %   Eosinophils Absolute 1.1 (H) 0.0 - 0.7 K/uL   Basophils Relative 0 %   Basophils Absolute 0.1 0.0 - 0.1 K/uL  Basic metabolic panel     Status: Abnormal   Collection Time: 07/31/17  1:03 AM  Result Value Ref Range   Sodium 136 135 - 145 mmol/L   Potassium 3.3 (L) 3.5 - 5.1 mmol/L   Chloride 99 (L) 101 - 111 mmol/L   CO2 28 22 - 32 mmol/L   Glucose, Bld 112 (H) 65 - 99 mg/dL   BUN 12 6 - 20 mg/dL   Creatinine, Ser 7.82 0.61 - 1.24 mg/dL   Calcium 8.2 (L) 8.9 - 10.3 mg/dL   GFR calc non Af Amer >60 >60 mL/min   GFR calc Af Amer >60 >60 mL/min   Anion gap 9 5 - 15  Ethanol     Status: None   Collection Time: 07/31/17  1:05 AM  Result Value Ref Range   Alcohol, Ethyl (B) <5 <5 mg/dL  Rapid urine drug screen (hospital performed)     Status: Abnormal   Collection Time: 07/31/17  5:40 AM  Result Value Ref Range   Opiates NONE DETECTED NONE DETECTED   Cocaine POSITIVE (A) NONE DETECTED   Benzodiazepines NONE DETECTED NONE DETECTED   Amphetamines NONE DETECTED NONE DETECTED   Tetrahydrocannabinol NONE DETECTED NONE DETECTED   Barbiturates NONE DETECTED NONE DETECTED   Imaging Studies: No results found.  ED COURSE  Nursing notes and initial vitals signs, including pulse oximetry, reviewed.  Vitals:   07/31/17 0400 07/31/17 0430 07/31/17 0500 07/31/17 0530  BP: 140/69 133/70 (!) 141/74 (!) 151/87  Pulse: 82 79 79 89  Resp: 19 18 20 19   Temp:      TempSrc:      SpO2: 99% 99% 99% 98%  Weight:      Height:       6:32 AM Patient given 600 milligrams of IV clindamycin. Will put him on oral clindamycin and have him follow-up with Dr. Chales Salmon, the oral surgeon on call. He was advised to contact Dr. Frederik Pear office later this morning. The patient's somnolence is not explained  by a drug screen showing cocaine. He may have taken a coingestant that is not showing up on his drug screen.  PROCEDURES    ED DIAGNOSES     ICD-10-CM   1. Abscess of lip K13.0   2. Dental caries K02.9   3. Cocaine abuse F14.10  [name], [name], MD 07/31/17 2595    Paula Libra, MD 07/31/17 [id number]    Paula Libra, MD 07/31/17 [phone]

## 2017-07-31 NOTE — ED Triage Notes (Signed)
Pt c/o pus drainage to inside of upper lip, he was seen two days ago and put on bactrim for facial cellulitis, pt is falling asleep in triage, pupils are pinpoint, he denies taking any drugs stating that he is tired and just got off work

## 2017-07-31 NOTE — Discharge Instructions (Signed)
Contact Dr. Frederik Pear office today and arrange for follow-up. He is an Transport planner and should be able to definitively treat your abscessed lip.

## 2017-07-31 NOTE — ED Notes (Signed)
ED Provider at bedside. 

## 2017-08-07 ENCOUNTER — Emergency Department (HOSPITAL_COMMUNITY): Payer: Self-pay

## 2017-08-07 ENCOUNTER — Emergency Department (HOSPITAL_COMMUNITY)
Admission: EM | Admit: 2017-08-07 | Discharge: 2017-08-07 | Disposition: A | Discharge status: Home / Self Care | Payer: Self-pay | Attending: Emergency Medicine | Admitting: Emergency Medicine

## 2017-08-07 ENCOUNTER — Other Ambulatory Visit: Payer: Self-pay

## 2017-08-07 DIAGNOSIS — J441 Chronic obstructive pulmonary disease with (acute) exacerbation: Secondary | ICD-10-CM | POA: Insufficient documentation

## 2017-08-07 DIAGNOSIS — F17210 Nicotine dependence, cigarettes, uncomplicated: Secondary | ICD-10-CM | POA: Insufficient documentation

## 2017-08-07 DIAGNOSIS — G8929 Other chronic pain: Secondary | ICD-10-CM | POA: Insufficient documentation

## 2017-08-07 DIAGNOSIS — K089 Disorder of teeth and supporting structures, unspecified: Secondary | ICD-10-CM

## 2017-08-07 DIAGNOSIS — K0889 Other specified disorders of teeth and supporting structures: Secondary | ICD-10-CM | POA: Insufficient documentation

## 2017-08-07 DIAGNOSIS — Z79899 Other long term (current) drug therapy: Secondary | ICD-10-CM | POA: Insufficient documentation

## 2017-08-07 MED ORDER — ALBUTEROL SULFATE (2.5 MG/3ML) 0.083% IN NEBU
5.0000 mg | INHALATION_SOLUTION | Freq: Once | RESPIRATORY_TRACT | Status: AC
  Administered 2017-08-07: 5 mg via RESPIRATORY_TRACT

## 2017-08-07 MED ORDER — CLINDAMYCIN HCL 300 MG PO CAPS
300.0000 mg | ORAL_CAPSULE | Freq: Once | ORAL | Status: AC
  Administered 2017-08-07: 300 mg via ORAL
  Filled 2017-08-07: qty 1

## 2017-08-07 MED ORDER — IPRATROPIUM-ALBUTEROL 0.5-2.5 (3) MG/3ML IN SOLN
3.0000 mL | Freq: Once | RESPIRATORY_TRACT | Status: AC
  Administered 2017-08-07: 3 mL via RESPIRATORY_TRACT
  Filled 2017-08-07: qty 3

## 2017-08-07 MED ORDER — DEXAMETHASONE 4 MG PO TABS
8.0000 mg | ORAL_TABLET | Freq: Once | ORAL | Status: AC
  Administered 2017-08-07: 8 mg via ORAL
  Filled 2017-08-07: qty 2

## 2017-08-07 MED ORDER — ALBUTEROL SULFATE HFA 108 (90 BASE) MCG/ACT IN AERS
2.0000 | INHALATION_SPRAY | Freq: Once | RESPIRATORY_TRACT | Status: AC
  Administered 2017-08-07: 2 via RESPIRATORY_TRACT
  Filled 2017-08-07: qty 6.7

## 2017-08-07 NOTE — ED Notes (Signed)
Pt ambulated in hallway without assistance. Pt O2 was between 89 and 90%

## 2017-08-07 NOTE — ED Triage Notes (Signed)
Pt reports feeling very SOB scattered wheeze thru out all fields with bilat bases decreased

## 2017-08-07 NOTE — ED Provider Notes (Signed)
[facility] DEPT Provider Note   CSN: [id number] Arrival date & time: 08/07/17  0344     History   Chief Complaint Chief Complaint  Patient presents with  . Shortness of Breath    HPI Robert Swanson is a 51 y.o. male.  The history is provided by the patient.  Shortness of Breath  This is a new problem. The current episode started more than 2 days ago. The problem has been gradually worsening. Associated symptoms include cough, wheezing and leg swelling. Pertinent negatives include no fever, no hemoptysis and no chest pain. Associated medical issues include COPD.  pt presents with increasing cough/shortness of breath He reports this feels like previous COPD exacerbations He is a smoker No CP He reports LE edema He also reports dental pain with possible abscess  Past Medical History:  Diagnosis Date  . Asthma   . Chronic back pain   . COPD (chronic obstructive pulmonary disease) (HCC)   . Vertigo     There are no active problems to display for this patient.   Past Surgical History:  Procedure Laterality Date  . APPENDECTOMY         Home Medications    Prior to Admission medications   Medication Sig Start Date End Date Taking? Authorizing Provider  albuterol (PROVENTIL HFA;VENTOLIN HFA) 108 (90 Base) MCG/ACT inhaler Inhale 1-2 puffs into the lungs every 6 (six) hours as needed for wheezing or shortness of breath. 06/26/17   Vanetta Mulders, MD  clindamycin (CLEOCIN) 150 MG capsule Take 2 capsules (300 mg total) by mouth 4 (four) times daily. 07/31/17   Molpus, Edker, MD  meclizine (ANTIVERT) 25 MG tablet Take 1 tablet (25 mg total) by mouth 3 (three) times daily as needed for dizziness. 06/26/17   Vanetta Mulders, MD  predniSONE (DELTASONE) 50 MG tablet Take 1 tablet (50 mg total) by mouth daily. 06/21/17   Mabe, Latanya Maudlin, MD    Family History No family history on file.  Social History Social History  Substance Use Topics  . Smoking status: Current Every Day  Smoker    Packs/day: 0.50    Types: Cigarettes  . Smokeless tobacco: Never Used  . Alcohol use Yes     Comment: denies current use     Allergies   Amoxicillin   Review of Systems Review of Systems  Constitutional: Negative for fever.  HENT: Positive for dental problem.   Respiratory: Positive for cough, shortness of breath and wheezing. Negative for hemoptysis.   Cardiovascular: Positive for leg swelling. Negative for chest pain.  All other systems reviewed and are negative.    Physical Exam Updated Vital Signs BP 114/78 (BP Location: Right Arm)   Pulse 77   Temp 97.6 F (36.4 C) (Oral)   Resp 16   SpO2 96%   Physical Exam  CONSTITUTIONAL: Disheveled, no acute distress HEAD: Normocephalic/atraumatic EYES: EOMI/PERRL ENMT: Mucous membranes moist, poor dentition, no focal abscess, diffuse gingival tenderness NECK: supple no meningeal signs SPINE/BACK:entire spine nontender CV: S1/S2 noted, no murmurs/rubs/gallops noted LUNGS: diffuse wheezing bilaterally, no distress ABDOMEN: soft, nontender, no rebound or guarding, bowel sounds noted throughout abdomen NEURO: Pt is awake/alert/appropriate, moves all extremitiesx4.  No facial droop.   EXTREMITIES: pulses normal/equal, full ROM, minimal symmetric pitting edema to lower extremities SKIN: warm, color normal PSYCH: no abnormalities of mood noted, alert and oriented to situation  ED Treatments / Results  Labs (all labs ordered are listed, but only abnormal results are displayed) Labs Reviewed - No data  to display  EKG  EKG Interpretation  Date/Time:  Friday August 07 2017 06:06:03 EDT Ventricular Rate:  74 PR Interval:    QRS Duration: 105 QT Interval:  384 QTC Calculation: 426 R Axis:   93 Text Interpretation:  Sinus rhythm Anteroseptal infarct, age indeterminate No significant change since last tracing Confirmed by Zadie Rhine ([id number]) on 08/07/2017 6:13:20 AM       Radiology Dg Chest 2  View  Result Date: 08/07/2017 CLINICAL DATA:  Asthma with shortness of breath and wheezing.  COPD. EXAM: CHEST  2 VIEW COMPARISON:  06/26/2017 FINDINGS: Hyperinflation correlating with history of COPD and asthma. There is no edema, consolidation, effusion, or pneumothorax. Normal heart size and mediastinal contours. IMPRESSION: Hyperinflation correlating with history.  No focal abnormality. Electronically Signed   By: Marnee Spring M.D.   On: 08/07/2017 04:57    Procedures Procedures (including critical care time)  Medications Ordered in ED Medications  albuterol (PROVENTIL) (2.5 MG/3ML) 0.083% nebulizer solution 5 mg (5 mg Nebulization Given 08/07/17 0512)  ipratropium-albuterol (DUONEB) 0.5-2.5 (3) MG/3ML nebulizer solution 3 mL (3 mLs Nebulization Given 08/07/17 0611)  albuterol (PROVENTIL HFA;VENTOLIN HFA) 108 (90 Base) MCG/ACT inhaler 2 puff (2 puffs Inhalation Given 08/07/17 0609)  dexamethasone (DECADRON) tablet 8 mg (8 mg Oral Given 08/07/17 0608)  clindamycin (CLEOCIN) capsule 300 mg (300 mg Oral Given 08/07/17 0608)     Initial Impression / Assessment and Plan / ED Course  I have reviewed the triage vital signs and the nursing notes.  Pertinent   imaging results that were available during my care of the patient were reviewed by me and considered in my medical decision making (see chart for details).     Pt stable He is improved Wheeze improved No distress noted He ambulated without difficulty He feels comfortable for d/c home For his dental pain, he just completed course of clindamycin, will defer any further meds  On my reassessment, his repeat pulse ox ws 92-93% Will d/c home   Final Clinical Impressions(s) / ED Diagnoses   Final diagnoses:  COPD exacerbation (HCC)  Chronic dental pain    New Prescriptions New Prescriptions   No medications on file     Zadie Rhine, MD 08/07/17 [phone]

## 2017-08-24 ENCOUNTER — Emergency Department (HOSPITAL_COMMUNITY)
Admission: EM | Admit: 2017-08-24 | Discharge: 2017-08-24 | Disposition: A | Discharge status: Home / Self Care | Payer: Self-pay | Attending: Emergency Medicine | Admitting: Emergency Medicine

## 2017-08-24 ENCOUNTER — Emergency Department (HOSPITAL_COMMUNITY): Payer: Self-pay

## 2017-08-24 ENCOUNTER — Encounter (HOSPITAL_COMMUNITY): Payer: Self-pay

## 2017-08-24 DIAGNOSIS — R410 Disorientation, unspecified: Secondary | ICD-10-CM | POA: Insufficient documentation

## 2017-08-24 DIAGNOSIS — F1410 Cocaine abuse, uncomplicated: Secondary | ICD-10-CM | POA: Insufficient documentation

## 2017-08-24 DIAGNOSIS — F1110 Opioid abuse, uncomplicated: Secondary | ICD-10-CM | POA: Insufficient documentation

## 2017-08-24 DIAGNOSIS — R0602 Shortness of breath: Secondary | ICD-10-CM | POA: Insufficient documentation

## 2017-08-24 DIAGNOSIS — F17210 Nicotine dependence, cigarettes, uncomplicated: Secondary | ICD-10-CM | POA: Insufficient documentation

## 2017-08-24 DIAGNOSIS — R269 Unspecified abnormalities of gait and mobility: Secondary | ICD-10-CM | POA: Insufficient documentation

## 2017-08-24 DIAGNOSIS — T50995A Adverse effect of other drugs, medicaments and biological substances, initial encounter: Secondary | ICD-10-CM | POA: Insufficient documentation

## 2017-08-24 DIAGNOSIS — R4781 Slurred speech: Secondary | ICD-10-CM | POA: Insufficient documentation

## 2017-08-24 DIAGNOSIS — J449 Chronic obstructive pulmonary disease, unspecified: Secondary | ICD-10-CM | POA: Insufficient documentation

## 2017-08-24 DIAGNOSIS — R05 Cough: Secondary | ICD-10-CM | POA: Insufficient documentation

## 2017-08-24 DIAGNOSIS — T50901A Poisoning by unspecified drugs, medicaments and biological substances, accidental (unintentional), initial encounter: Secondary | ICD-10-CM

## 2017-08-24 LAB — COMPREHENSIVE METABOLIC PANEL
ALT: 11 U/L — ABNORMAL LOW (ref 17–63)
AST: 19 U/L (ref 15–41)
Albumin: 3.7 g/dL (ref 3.5–5.0)
Alkaline Phosphatase: 54 U/L (ref 38–126)
Anion gap: 6 (ref 5–15)
BUN: 15 mg/dL (ref 6–20)
CO2: 26 mmol/L (ref 22–32)
Calcium: 8.6 mg/dL — ABNORMAL LOW (ref 8.9–10.3)
Chloride: 103 mmol/L (ref 101–111)
Creatinine, Ser: 1.04 mg/dL (ref 0.61–1.24)
GFR calc Af Amer: >60 mL/min (ref >60)
GFR calc non Af Amer: >60 mL/min (ref >60)
Glucose, Bld: 127 mg/dL — ABNORMAL HIGH (ref 65–99)
Potassium: 4.3 mmol/L (ref 3.5–5.1)
Sodium: 135 mmol/L (ref 135–145)
Total Bilirubin: 0.8 mg/dL (ref 0.3–1.2)
Total Protein: 6.8 g/dL (ref 6.5–8.1)

## 2017-08-24 LAB — CBC
HCT: 44.3 % (ref 39.0–52.0)
Hemoglobin: 14.8 g/dL (ref 13.0–17.0)
MCH: 29.4 pg (ref 26.0–34.0)
MCHC: 33.4 g/dL (ref 30.0–36.0)
MCV: 88.1 fL (ref 78.0–100.0)
Platelets: 413 10*3/uL — ABNORMAL HIGH (ref 150–400)
RBC: 5.03 MIL/uL (ref 4.22–5.81)
RDW: 14 % (ref 11.5–15.5)
WBC: 12.1 10*3/uL — ABNORMAL HIGH (ref 4.0–10.5)

## 2017-08-24 LAB — SALICYLATE LEVEL: Salicylate Lvl: <7 mg/dL (ref 2.8–30.0)

## 2017-08-24 LAB — ACETAMINOPHEN LEVEL: Acetaminophen (Tylenol), Serum: <10 ug/mL — ABNORMAL LOW (ref 10–30)

## 2017-08-24 LAB — RAPID URINE DRUG SCREEN, HOSP PERFORMED
Amphetamines: NOT DETECTED
Barbiturates: NOT DETECTED
Benzodiazepines: NOT DETECTED
Cocaine: POSITIVE — AB
Opiates: POSITIVE — AB
Tetrahydrocannabinol: NOT DETECTED

## 2017-08-24 LAB — CBG MONITORING, ED: Glucose-Capillary: 86 mg/dL (ref 65–99)

## 2017-08-24 LAB — ETHANOL: Alcohol, Ethyl (B): <5 mg/dL (ref <5)

## 2017-08-24 MED ORDER — DEXAMETHASONE SODIUM PHOSPHATE 10 MG/ML IJ SOLN
10.0000 mg | Freq: Once | INTRAMUSCULAR | Status: AC
Start: 2017-08-24 — End: 2017-08-24
  Administered 2017-08-24: 10 mg via INTRAVENOUS
  Filled 2017-08-24: qty 1

## 2017-08-24 MED ORDER — LORAZEPAM 2 MG/ML IJ SOLN
1.0000 mg | Freq: Once | INTRAMUSCULAR | Status: AC
  Administered 2017-08-24: 1 mg via INTRAVENOUS
  Filled 2017-08-24: qty 1

## 2017-08-24 MED ORDER — SODIUM CHLORIDE 0.9 % IV BOLUS (SEPSIS): 1000.0000 mL | Freq: Once | INTRAVENOUS | Status: DC

## 2017-08-24 MED ORDER — ALBUTEROL SULFATE HFA 108 (90 BASE) MCG/ACT IN AERS
4.0000 | INHALATION_SPRAY | Freq: Once | RESPIRATORY_TRACT | Status: AC
Start: 2017-08-24 — End: 2017-08-24
  Administered 2017-08-24: 4 via RESPIRATORY_TRACT
  Filled 2017-08-24: qty 6.7

## 2017-08-24 MED ORDER — SODIUM CHLORIDE 0.9 % IV BOLUS (SEPSIS)
1000.0000 mL | Freq: Once | INTRAVENOUS | Status: AC
  Administered 2017-08-24: 1000 mL via INTRAVENOUS

## 2017-08-24 MED ORDER — IPRATROPIUM-ALBUTEROL 0.5-2.5 (3) MG/3ML IN SOLN
3.0000 mL | Freq: Once | RESPIRATORY_TRACT | Status: AC
  Administered 2017-08-24: 3 mL via RESPIRATORY_TRACT
  Filled 2017-08-24: qty 3

## 2017-08-24 NOTE — ED Notes (Signed)
Pt is tolerating PO challenge well.

## 2017-08-24 NOTE — ED Notes (Signed)
Pt is resting comfortably.  Reports feeling "a little better."  Pt denies any complaints at this time.

## 2017-08-24 NOTE — ED Provider Notes (Signed)
[facility] Provider Note   CSN: [id number] Arrival date & time: 08/24/17  1159     History   Chief Complaint Chief Complaint  Patient presents with  . Drug Overdose  . Shortness of Breath    HPI Robert Swanson is a 51 y.o. male.  HPI   51 yo m with PMHx COPD, drug use here with SOB. Pt states he was using cocaine, heroin today then put a cotton ball full of "something" in his mouth. He then began to experience uneasiness and a sensation of shortness of breath. He currently feels "off a little" and feels like he is wheezing. No CP. No SI, HI. He did not mean to overdose. He admits he smokes regularly and is always short of breath, but just feels a little more SOb than usual. No CP. No recent fevers or chills. No sputum production. No leg swelling. No headache.  Past Medical History:  Diagnosis Date  . Asthma   . Chronic back pain   . COPD (chronic obstructive pulmonary disease) (HCC)   . Vertigo     There are no active problems to display for this patient.   Past Surgical History:  Procedure Laterality Date  . APPENDECTOMY         Home Medications    Prior to Admission medications   Medication Sig Start Date End Date Taking? Authorizing Provider  albuterol (PROVENTIL HFA;VENTOLIN HFA) 108 (90 Base) MCG/ACT inhaler Inhale 1-2 puffs into the lungs every 6 (six) hours as needed for wheezing or shortness of breath. 06/26/17  Yes Vanetta MuldersZackowski, Scott, MD    Family History No family history on file.  Social History Social History  Substance Use Topics  . Smoking status: Current Every Day Smoker    Packs/day: 1.50    Types: Cigarettes  . Smokeless tobacco: Never Used  . Alcohol use Yes     Allergies   Amoxicillin   Review of Systems Review of Systems  Constitutional: Positive for fatigue. Negative for chills and fever.  HENT: Negative for congestion and rhinorrhea.   Eyes: Negative for visual disturbance.  Respiratory: Positive for cough. Negative  for shortness of breath and wheezing.   Cardiovascular: Negative for chest pain and leg swelling.  Gastrointestinal: Negative for abdominal pain, diarrhea, nausea and vomiting.  Genitourinary: Negative for dysuria and flank pain.  Musculoskeletal: Positive for gait problem. Negative for neck pain and neck stiffness.  Skin: Negative for rash and wound.  Allergic/Immunologic: Negative for immunocompromised state.  Neurological: Negative for syncope, weakness and headaches.  Psychiatric/Behavioral: Positive for confusion.  All other systems reviewed and are negative.    Physical Exam Updated Vital Signs BP 108/79 (BP Location: Right Arm)   Pulse 73   Temp 98.1 F (36.7 C) (Oral)   Resp 16   Ht 5\' 4"  (1.626 m)   Wt 81.6 kg (180 lb)   SpO2 99%   BMI 30.90 kg/m   Physical Exam  Constitutional: He appears well-developed and well-nourished. No distress.  HENT:  Head: Normocephalic and atraumatic.  Eyes: Pupils are equal, round, and reactive to light. Conjunctivae are normal.  Mid-dilated but reactive pupils. Vertical and horizontal nystagmus  Neck: Neck supple.  Cardiovascular: Normal rate, regular rhythm and normal heart sounds.  Exam reveals no friction rub.   No murmur heard. Pulmonary/Chest: Effort normal and breath sounds normal. No respiratory distress. He has no wheezes. He has no rales.  Abdominal: Soft. Bowel sounds are normal. He exhibits no distension.  Musculoskeletal: He  exhibits no edema.  Neurological: He is alert. He exhibits normal muscle tone.  Slightly slurred speech. Gait deferred. MAE with 5/5 strength b/l UE and LE. Normal sensation to light touch.  Skin: Skin is warm. Capillary refill takes less than 2 seconds.  Psychiatric: He has a normal mood and affect.  Nursing note and vitals reviewed.    ED Treatments / Results  Labs (all labs ordered are listed, but only abnormal results are displayed) Labs Reviewed  COMPREHENSIVE METABOLIC PANEL - Abnormal;  Notable for the following:       Result Value   Glucose, Bld 127 (*)    Calcium 8.6 (*)    ALT 11 (*)    All other components within normal limits  ACETAMINOPHEN LEVEL - Abnormal; Notable for the following:    Acetaminophen (Tylenol), Serum <10 (*)    All other components within normal limits  CBC - Abnormal; Notable for the following:    WBC 12.1 (*)    Platelets 413 (*)    All other components within normal limits  RAPID URINE DRUG SCREEN, HOSP PERFORMED - Abnormal; Notable for the following:    Opiates POSITIVE (*)    Cocaine POSITIVE (*)    All other components within normal limits  ETHANOL  SALICYLATE LEVEL  CBG MONITORING, ED    EKG  EKG Interpretation  Date/Time:  Monday August 24 2017 12:19:18 EDT Ventricular Rate:  106 PR Interval:    QRS Duration: 101 QT Interval:  331 QTC Calculation: 440 R Axis:   143 Text Interpretation:  Sinus tachycardia Right axis deviation Low voltage, precordial leads Consider anterior infarct No significant change since last tracing Confirmed by Shaune Pollack [phone]) on 08/24/2017 7:58:37 PM       Radiology Dg Chest 2 View  Result Date: 08/24/2017 CLINICAL DATA:  51 year old male with a history of shortness of breath EXAM: CHEST  2 VIEW COMPARISON:  08/07/2017, 07/19/2017, 06/26/2017 No displaced fracture FINDINGS: Cardiomediastinal silhouette unchanged in size and contour. No evidence of central vascular congestion. Ill-defined linear opacities of the bilateral lungs, present on comparison chest x-rays. No evidence of pleural effusion or confluent airspace disease. IMPRESSION: Chronic lung changes without evidence of superimposed acute cardiopulmonary disease. Electronically Signed   By: Gilmer Mor D.O.   On: 08/24/2017 12:51    Procedures Procedures (including critical care time)  Medications Ordered in ED Medications  ipratropium-albuterol (DUONEB) 0.5-2.5 (3) MG/3ML nebulizer solution 3 mL (3 mLs Nebulization Given 08/24/17  1252)  sodium chloride 0.9 % bolus 1,000 mL (0 mLs Intravenous Stopped 08/24/17 1729)  sodium chloride 0.9 % bolus 1,000 mL (0 mLs Intravenous Stopped 08/24/17 1729)  LORazepam (ATIVAN) injection 1 mg (1 mg Intravenous Given 08/24/17 1302)  dexamethasone (DECADRON) injection 10 mg (10 mg Intravenous Given 08/24/17 1542)  ipratropium-albuterol (DUONEB) 0.5-2.5 (3) MG/3ML nebulizer solution 3 mL (3 mLs Nebulization Given 08/24/17 1540)  albuterol (PROVENTIL HFA;VENTOLIN HFA) 108 (90 Base) MCG/ACT inhaler 4 puff (4 puffs Inhalation Given 08/24/17 1732)     Initial Impression / Assessment and Plan / ED Course  I have reviewed the triage vital signs and the nursing notes.  Pertinent labs & imaging results that were available during my care of the patient were reviewed by me and considered in my medical decision making (see chart for details).     51 yo M with PMHx as above here with subjective SOB and "weird feeling" after sucking on a cotton ball soaked with unknown drug. I suspect pt has  mixed substance abuse pathology with components of opioids, cocaine (he admits to this) as well as possible PCP/meth/hallucinogen given his nystagmus, possible transient hallucinations. Will give ativan given his sympathomimetic picture on arrival, monitor in ED. No signs of head trauma. He is moving all extremities, and exam is not c/w CVA.  Pt markedly improved with ativan and observaiton in the ED. Labs are reassuring as above. Mild leukocytosis is likely 2/2 drug use. No fever, no s/s to suggest meningitis. Tox labs neg. UDS + cocaine, opiates. Pt feels improved, is tolerating PO. He does have some mild wheezing, was given decadron and neb/inhaler here. Will d/c home.  Final Clinical Impressions(s) / ED Diagnoses   Final diagnoses:  Accidental overdose, initial encounter    New Prescriptions Discharge Medication List as of 08/24/2017  5:24 PM       Shaune Pollack, MD 08/24/17 2000

## 2017-08-24 NOTE — ED Triage Notes (Signed)
Patient presents with complaints of shortness of breath. Patient has history of asthma and is a 20 year smoker. 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 Patient reports he also had "someone give me a cotton ball with some drug in it that they told me would make me feel better. I dont know what it was, but as soon as I took it, I started feeling funny and short of breath." Patient speaking in full sentences and managing his own airway in triage.

## 2017-09-26 ENCOUNTER — Emergency Department (HOSPITAL_BASED_OUTPATIENT_CLINIC_OR_DEPARTMENT_OTHER)
Admission: EM | Admit: 2017-09-26 | Discharge: 2017-09-26 | Disposition: A | Discharge status: Home / Self Care | Payer: Self-pay | Attending: Emergency Medicine | Admitting: Emergency Medicine

## 2017-09-26 ENCOUNTER — Encounter (HOSPITAL_BASED_OUTPATIENT_CLINIC_OR_DEPARTMENT_OTHER): Payer: Self-pay | Admitting: *Deleted

## 2017-09-26 DIAGNOSIS — J45901 Unspecified asthma with (acute) exacerbation: Secondary | ICD-10-CM

## 2017-09-26 DIAGNOSIS — F1490 Cocaine use, unspecified, uncomplicated: Secondary | ICD-10-CM | POA: Insufficient documentation

## 2017-09-26 DIAGNOSIS — F17210 Nicotine dependence, cigarettes, uncomplicated: Secondary | ICD-10-CM | POA: Insufficient documentation

## 2017-09-26 DIAGNOSIS — J45909 Unspecified asthma, uncomplicated: Secondary | ICD-10-CM | POA: Insufficient documentation

## 2017-09-26 DIAGNOSIS — J449 Chronic obstructive pulmonary disease, unspecified: Secondary | ICD-10-CM | POA: Insufficient documentation

## 2017-09-26 MED ORDER — IPRATROPIUM-ALBUTEROL 0.5-2.5 (3) MG/3ML IN SOLN
3.0000 mL | Freq: Once | RESPIRATORY_TRACT | Status: AC
  Administered 2017-09-26: 3 mL via RESPIRATORY_TRACT

## 2017-09-26 MED ORDER — ALBUTEROL SULFATE (2.5 MG/3ML) 0.083% IN NEBU
INHALATION_SOLUTION | RESPIRATORY_TRACT | Status: AC
  Administered 2017-09-26: 2.5 mg via RESPIRATORY_TRACT
  Filled 2017-09-26: qty 3

## 2017-09-26 MED ORDER — ALBUTEROL SULFATE HFA 108 (90 BASE) MCG/ACT IN AERS
2.0000 | INHALATION_SPRAY | RESPIRATORY_TRACT | Status: DC | PRN
  Administered 2017-09-26: 2 via RESPIRATORY_TRACT

## 2017-09-26 MED ORDER — ALBUTEROL SULFATE HFA 108 (90 BASE) MCG/ACT IN AERS
INHALATION_SPRAY | RESPIRATORY_TRACT | Status: AC
  Filled 2017-09-26: qty 6.7

## 2017-09-26 MED ORDER — IPRATROPIUM-ALBUTEROL 0.5-2.5 (3) MG/3ML IN SOLN
RESPIRATORY_TRACT | Status: AC
  Administered 2017-09-26: 3 mL via RESPIRATORY_TRACT
  Filled 2017-09-26: qty 3

## 2017-09-26 MED ORDER — ALBUTEROL SULFATE (2.5 MG/3ML) 0.083% IN NEBU
2.5000 mg | INHALATION_SOLUTION | Freq: Once | RESPIRATORY_TRACT | Status: AC
Start: 2017-09-26 — End: 2017-09-26
  Administered 2017-09-26: 2.5 mg via RESPIRATORY_TRACT

## 2017-09-26 NOTE — ED Triage Notes (Signed)
Pt reports asthma exacerbation that began this morning -- reports currently out of his inhaler. Pt able to speak in complete sentences. Denies fever, recent illness, n/v/d.

## 2017-09-26 NOTE — Discharge Instructions (Signed)
Use your albuterol inhaler with spacer 2 puffs every 4 hours as needed for shortness of breath. Return if needed more than every 4 hours. Call the number on these instructions to get a primary care physician. Ask your new primary care physician to help you to stop smoking

## 2017-09-26 NOTE — ED Notes (Signed)
Ambulated in ER without any complication. HR 115, RR 28, SPO2 95%. No complications noted.

## 2017-09-26 NOTE — ED Provider Notes (Signed)
[facility]   CSN: [id number] Arrival date & time: 09/26/17  0705     History   Chief Complaint Chief Complaint  Patient presents with  . Asthma    HPI Robert Swanson is a 51 y.o. male.Complains of wheezing typical of asthma onset 3 AM today other associated symptoms include slight nonproductive cough no fever no other associated symptoms. No treatment prior to coming here as he has run out of his inhaler.. No other associated symptoms nothing makes symptoms better or worse.  HPI  Past Medical History:  Diagnosis Date  . Asthma   . Chronic back pain   . COPD (chronic obstructive pulmonary disease) (HCC)   . Vertigo    No history of intubations. he's been on steroids in the past There are no active problems to display for this patient.   Past Surgical History:  Procedure Laterality Date  . APPENDECTOMY         Home Medications    Prior to Admission medications   Medication Sig Start Date End Date Taking? Authorizing Provider  albuterol (PROVENTIL HFA;VENTOLIN HFA) 108 (90 Base) MCG/ACT inhaler Inhale 1-2 puffs into the lungs every 6 (six) hours as needed for wheezing or shortness of breath. 06/26/17  Yes Vanetta Mulders, MD    Family History No family history on file.  Social History Social History  Substance Use Topics  . Smoking status: Current Every Day Smoker    Packs/day: 1.50    Types: Cigarettes  . Smokeless tobacco: Never Used  . Alcohol use Yes    Positive cocaine use last time 2 months ago no history of IV drug use Allergies   Amoxicillin   Review of Systems Review of Systems  Constitutional: Negative.   HENT: Negative.   Respiratory: Positive for cough, shortness of breath and wheezing.   Cardiovascular: Negative.   Gastrointestinal: Negative.   Musculoskeletal: Negative.   Skin: Negative.   Neurological: Negative.   Psychiatric/Behavioral: Negative.   All other systems reviewed and are  negative.    Physical Exam Updated Vital Signs BP 132/68 (BP Location: Right Arm)   Pulse 96   Temp 98 F (36.7 C) (Oral)   Resp 20   Ht  (1.626 m)   Wt 81.6 kg (180 lb)   SpO2 96%   BMI 30.90 kg/m   Physical Exam  Constitutional: He appears well-developed and well-nourished. No distress.  HENT:  Head: Normocephalic and atraumatic.  Eyes: Pupils are equal, round, and reactive to light. Conjunctivae are normal.  Neck: Neck supple. No tracheal deviation present. No thyromegaly present.  Cardiovascular: Normal rate and regular rhythm.   No murmur heard. Pulmonary/Chest: Effort normal. He has wheezes.  Speaks in paragraphs no respiratory distress. Expiratory wheezes  Abdominal: Soft. Bowel sounds are normal. He exhibits no distension. There is no tenderness.  Musculoskeletal: Normal range of motion. He exhibits no edema or tenderness.  Neurological: He is alert. Coordination normal.  Skin: Skin is warm and dry. No rash noted.  Psychiatric: He has a normal mood and affect.  Nursing note and vitals reviewed.    ED Treatments / Results  Labs (all labs ordered are listed, but only abnormal results are displayed) Labs Reviewed - No data to display  EKG  EKG Interpretation None       Radiology No results found.  Procedures Procedures (including critical care time)  Medications Ordered in ED Medications  albuterol (PROVENTIL) (2.5 MG/3ML) 0.083% nebulizer solution 2.5 mg (2.5 mg  Nebulization Given 09/26/17 0719)  ipratropium-albuterol (DUONEB) 0.5-2.5 (3) MG/3ML nebulizer solution 3 mL (3 mLs Nebulization Given 09/26/17 0719)     Initial Impression / Assessment and Plan / ED Course  I have reviewed the triage vital signs and the nursing notes.  Pertinent labs & imaging results that were available during my care of the patient were reviewed by me and considered in my medical decision making (see chart for details).   7:45 AM patient reports he's breathing at  baseline after DuoNeb nebulized treatment. On exam he speaks in paragraphs lungs with minimal end expiratory wheezes. He is able to ambulate around the emergency department without difficulty and without desaturation of pulse ox  Plan he'll get an albuterol inhaler to go fuse 2 puffs every 4 hours. He has a spacer at home he is advised to return to the emergency department if needed more than every 4 hours. Referral to primary care. I counseled patient 5 minutes on smoking cessation  Final Clinical Impressions(s) / ED Diagnoses  Diagnoses #1 exacerbation of asthma. #2 tobacco abuse Final diagnoses:  None    New Prescriptions New Prescriptions   No medications on file     Doug Sou, MD 09/26/17 [phone]

## 2018-02-05 ENCOUNTER — Emergency Department (HOSPITAL_BASED_OUTPATIENT_CLINIC_OR_DEPARTMENT_OTHER)
Admission: EM | Admit: 2018-02-05 | Discharge: 2018-02-05 | Disposition: A | Discharge status: Home / Self Care | Payer: Self-pay | Attending: Emergency Medicine | Admitting: Emergency Medicine

## 2018-02-05 ENCOUNTER — Other Ambulatory Visit: Payer: Self-pay

## 2018-02-05 ENCOUNTER — Encounter (HOSPITAL_BASED_OUTPATIENT_CLINIC_OR_DEPARTMENT_OTHER): Payer: Self-pay | Admitting: *Deleted

## 2018-02-05 DIAGNOSIS — T162XXA Foreign body in left ear, initial encounter: Secondary | ICD-10-CM | POA: Insufficient documentation

## 2018-02-05 DIAGNOSIS — H6122 Impacted cerumen, left ear: Secondary | ICD-10-CM | POA: Insufficient documentation

## 2018-02-05 DIAGNOSIS — Y9389 Activity, other specified: Secondary | ICD-10-CM | POA: Insufficient documentation

## 2018-02-05 DIAGNOSIS — F17210 Nicotine dependence, cigarettes, uncomplicated: Secondary | ICD-10-CM | POA: Insufficient documentation

## 2018-02-05 DIAGNOSIS — X58XXXA Exposure to other specified factors, initial encounter: Secondary | ICD-10-CM | POA: Insufficient documentation

## 2018-02-05 DIAGNOSIS — J449 Chronic obstructive pulmonary disease, unspecified: Secondary | ICD-10-CM | POA: Insufficient documentation

## 2018-02-05 DIAGNOSIS — Y929 Unspecified place or not applicable: Secondary | ICD-10-CM | POA: Insufficient documentation

## 2018-02-05 DIAGNOSIS — Y999 Unspecified external cause status: Secondary | ICD-10-CM | POA: Insufficient documentation

## 2018-02-05 DIAGNOSIS — J45909 Unspecified asthma, uncomplicated: Secondary | ICD-10-CM | POA: Insufficient documentation

## 2018-02-05 MED ORDER — CIPROFLOXACIN HCL 0.2 % OT SOLN: 0.2000 mL | Freq: Two times a day (BID) | OTIC | 0 refills | Status: AC

## 2018-02-05 NOTE — ED Notes (Signed)
ED Provider at bedside. 

## 2018-02-05 NOTE — ED Notes (Signed)
ED Provider at bedside. Resident at bedside for exam, triage assessment deferred.

## 2018-02-05 NOTE — ED Triage Notes (Signed)
Pt reports sticking toilet tissue into his left ear to clean it 2 weeks ago. Part of tissue got stuck and he has been trying to use qtips to remove it. Extensive teaching regarding ear anatomy and proper cleaning techniques, if needed. Pt verbalizes understanding, denies any other c/o.

## 2018-02-05 NOTE — Care Management Note (Signed)
Case Management Note  CM consulted for no pcp and no ins with need for follow up.  [facility]  Pt can call to make an appointment on his own.  CM will send a note to CM and [facility].  No further CM needs noted at this time.

## 2018-02-05 NOTE — ED Notes (Signed)
NAD at this time. Pt is stable and going home.  

## 2018-02-05 NOTE — Discharge Instructions (Signed)
Avoid sticking anything in your ears.  Your hearing should improve over the next week.  [facility]  Please call to establish care.  If you develop worsening ear pain, fevers or chills, seek medical care.  I have also attached an ear specialist if you can call if symptoms worsen.  Take Tylenol for pain every 6 hours as needed.  Apply the antibiotic drops to the ear twice daily for 7 days.  Seek medical care if you develop ear drainage.

## 2018-02-05 NOTE — ED Provider Notes (Signed)
[facility] EMERGENCY DEPARTMENT Provider Note   CSN: [id number] Arrival date & time: 02/05/18  [id number]     History   Chief Complaint Chief Complaint  Patient presents with  . Otalgia    HPI Robert Swanson is a 52 y.o. male.  Patient is a 52 year old male presenting with foreign object in left ear.  PMH significant for COPD, polysubstance abuse (cocaine, heroin).  Onset 2 weeks ago when patient inserted toilet paper into left ear "to drain the water" following a shower.  Patient states some of the toilet paper broke off when he last attempted to drain the ear.  He has tried using Debrox to soften the ear without success.  Patient endorses muffling of the left ear with slight decrease in hearing.  He denies fevers or chills, complete loss of hearing, headache, nausea or vomiting, lightheadedness or vertigo, ear drainage.  Patient denies history of ear surgery.  He chronically uses Q-tips to clean his ears.  States his right ear has been normal.      Past Medical History:  Diagnosis Date  . Asthma   . Chronic back pain   . COPD (chronic obstructive pulmonary disease) (HCC)   . Vertigo     There are no active problems to display for this patient.   Past Surgical History:  Procedure Laterality Date  . APPENDECTOMY         Home Medications    Prior to Admission medications   Medication Sig Start Date End Date Taking? Authorizing Provider  albuterol (PROVENTIL HFA;VENTOLIN HFA) 108 (90 Base) MCG/ACT inhaler Inhale 1-2 puffs into the lungs every 6 (six) hours as needed for wheezing or shortness of breath. 06/26/17   Vanetta MuldersZackowski, Scott, MD  Ciprofloxacin HCl (CETRAXAL) 0.2 % otic solution Place 0.2 mLs into the left ear 2 (two) times daily for 7 days. 02/05/18 02/12/18  Wendee BeaversMcMullen, Semiah Konczal J, DO    Family History History reviewed. No pertinent family history.  Social History Social History   Tobacco Use  . Smoking status: Current Every Day Smoker    Packs/day: 1.50      Types: Cigarettes  . Smokeless tobacco: Never Used  Substance Use Topics  . Alcohol use: Yes  . Drug use: Yes    Types: Marijuana, Cocaine    Comment: denies current use     Allergies   Amoxicillin   Review of Systems Review of Systems  Constitutional: Negative for chills and fever.  HENT: Negative for ear discharge, ear pain and hearing loss.   Eyes: Negative for pain and visual disturbance.  Respiratory: Negative for cough and shortness of breath.   Cardiovascular: Negative for chest pain and palpitations.  Gastrointestinal: Negative for nausea and vomiting.  Musculoskeletal: Negative for neck pain and neck stiffness.  Skin: Negative for color change and wound.  Neurological: Negative for dizziness, light-headedness and headaches.  All other systems reviewed and are negative.    Physical Exam Updated Vital Signs BP 112/65 (BP Location: Left Arm)   Pulse 82   Temp 98.8 F (37.1 C) (Oral)   Resp 16   Ht 5\' 4"  (1.626 m)   Wt 81.6 kg (180 lb)   SpO2 98%   BMI 30.90 kg/m   Physical Exam  Constitutional: He appears well-developed and well-nourished.  HENT:  Head: Normocephalic and atraumatic.  Right Ear: External ear normal.  Nonoccluded impaction of canal consistent with toilet paper without signs of TM perforation without drainage  Eyes: Conjunctivae are normal.  Neck: Neck  supple.  Cardiovascular: Normal rate and regular rhythm.  No murmur heard. Pulmonary/Chest: Effort normal and breath sounds normal. No respiratory distress.  Musculoskeletal: He exhibits no edema.  Neurological: He is alert.  Skin: Skin is warm and dry.  Psychiatric: He has a normal mood and affect.  Nursing note and vitals reviewed.    ED Treatments / Results  Labs (all labs ordered are listed, but only abnormal results are displayed) Labs Reviewed - No data to display  EKG  EKG Interpretation None       Radiology No results found.  Procedures Procedures (including  critical care time)  Medications Ordered in ED Medications - No data to display   Initial Impression / Assessment and Plan / ED Course  I have reviewed the triage vital signs and the nursing notes.  Pertinent labs & imaging results that were available during my care of the patient were reviewed by me and considered in my medical decision making (see chart for details).  Patient is a 52 year old male presenting with foreign object in left ear.  PMH significant for COPD, polysubstance abuse (cocaine, heroin).  Vitals stable.  Patient without fever.  Well-appearing on exam.  No obvious signs of external damage to ear.  Right ear unremarkable with patent and gray TM.  Absent tenderness to pinna, tragus or mastoid on palpation.  Absent drainage.  There is an obstruction to inferior aspect of middle ear with no signs of TM perforation.    Pea-size ball of toilet paper successfully removed with alligator forceps.  Minimal bleeding without purulence.  TM with minimal scarring without perforation.  Patient given ciprofloxacin eardrops with instructions to take twice daily for 7 days.  Case management consulted and will contact patient regarding no PCP and lack of insurance.  Patient also given contact information for ENT if symptoms worsen.  Reviewed return precautions.  Final Clinical Impressions(s) / ED Diagnoses   Final diagnoses:  Impacted cerumen of left ear    ED Discharge Orders        Ordered    Ciprofloxacin HCl (CETRAXAL) 0.2 % otic solution  2 times daily     02/05/18 0908       Wendee Beavers, DO 02/05/18 8119    Alvira Monday, MD 02/07/18 [phone]

## 2018-02-05 NOTE — ED Notes (Signed)
Previous note by this rn, not sam, rn. 

## 2018-02-05 NOTE — ED Notes (Signed)
Supplies gathered and placed at bedside for md. 

## 2018-02-05 NOTE — ED Notes (Signed)
Previous note by this rn, not sam, rn.

## 2018-02-05 NOTE — ED Notes (Signed)
HEENT assessment by this rn, not sam, rn.

## 2018-02-09 ENCOUNTER — Telehealth: Payer: Self-pay

## 2018-02-09 NOTE — Telephone Encounter (Signed)
Message received from Eldridge AbrahamsAngela Kritzer, RN CM noting that the [facility] the patient's AVS . He can contact the clinic to schedule a follow up appointment. At this time,there are no hospital follow up appointments available

## 2018-02-10 ENCOUNTER — Telehealth: Payer: Self-pay

## 2018-02-10 NOTE — Telephone Encounter (Signed)
Attempted to contact the patient to schedule a hospital follow up appointment and provide him the contact information for [facility] [place] if he is interested. Call placed to # [phone] (M) and the message stated that the voice mailbox has not been set up,unable to leave a message. Call also placed to #  [phone] (H) and the number was not in service.   Update provided to Eldridge AbrahamsAngela Kritzer, RN CM

## 2018-02-12 ENCOUNTER — Telehealth: Payer: Self-pay | Admitting: General Practice

## 2018-02-12 NOTE — Telephone Encounter (Signed)
Call placed to patient #[phone] to help schedule a hospital follow up appointment. Spoke with patient and introduced myself. Informed patient that hospital case manager had given us his information because patient doesn't have a PCP. Patient agreed to be seen by our provider. Appointment was scheduled for 3/8 at 2:00pm. Informed patient that we will be giving him a call the day prior to confirm his appointment.

## 2018-02-19 ENCOUNTER — Inpatient Hospital Stay: Payer: Self-pay | Admitting: Family Medicine

## 2018-02-21 ENCOUNTER — Emergency Department (HOSPITAL_BASED_OUTPATIENT_CLINIC_OR_DEPARTMENT_OTHER)
Admission: EM | Admit: 2018-02-21 | Discharge: 2018-02-22 | Disposition: A | Discharge status: Home / Self Care | Payer: Self-pay | Attending: Emergency Medicine | Admitting: Emergency Medicine

## 2018-02-21 ENCOUNTER — Other Ambulatory Visit: Payer: Self-pay

## 2018-02-21 ENCOUNTER — Encounter (HOSPITAL_BASED_OUTPATIENT_CLINIC_OR_DEPARTMENT_OTHER): Payer: Self-pay | Admitting: *Deleted

## 2018-02-21 DIAGNOSIS — F17210 Nicotine dependence, cigarettes, uncomplicated: Secondary | ICD-10-CM | POA: Insufficient documentation

## 2018-02-21 DIAGNOSIS — J449 Chronic obstructive pulmonary disease, unspecified: Secondary | ICD-10-CM | POA: Insufficient documentation

## 2018-02-21 DIAGNOSIS — J340 Abscess, furuncle and carbuncle of nose: Secondary | ICD-10-CM | POA: Insufficient documentation

## 2018-02-21 DIAGNOSIS — J45909 Unspecified asthma, uncomplicated: Secondary | ICD-10-CM | POA: Insufficient documentation

## 2018-02-21 DIAGNOSIS — L03211 Cellulitis of face: Secondary | ICD-10-CM | POA: Insufficient documentation

## 2018-02-21 MED ORDER — LIDOCAINE HCL 2 % IJ SOLN
5.0000 mL | Freq: Once | INTRAMUSCULAR | Status: AC
  Administered 2018-02-22: 100 mg
  Filled 2018-02-21: qty 20

## 2018-02-21 MED ORDER — CLINDAMYCIN HCL 150 MG PO CAPS
300.0000 mg | ORAL_CAPSULE | Freq: Once | ORAL | Status: AC
  Administered 2018-02-21: 300 mg via ORAL
  Filled 2018-02-21: qty 2

## 2018-02-21 NOTE — ED Notes (Signed)
Pt states he pulled some hairs out of his nose a couple of days ago. Now presents with redness and swelling to same. Tender to touch.

## 2018-02-21 NOTE — ED Triage Notes (Signed)
Pt report swelling in his nose and upper lip after pulling hairs from his nose using tweezers yesterday. Pt has nasal congestion

## 2018-02-21 NOTE — ED Notes (Signed)
ED Provider at bedside. 

## 2018-02-22 MED ORDER — TRAMADOL HCL 50 MG PO TABS: 50.0000 mg | ORAL_TABLET | Freq: Four times a day (QID) | ORAL | 0 refills | Status: DC | PRN

## 2018-02-22 MED ORDER — CLINDAMYCIN HCL 150 MG PO CAPS: 300.0000 mg | ORAL_CAPSULE | Freq: Three times a day (TID) | ORAL | 0 refills | Status: AC

## 2018-02-22 NOTE — ED Provider Notes (Signed)
[facility] EMERGENCY DEPARTMENT Provider Note   CSN: [id number] Arrival date & time: 02/21/18  2112     History   Chief Complaint Chief Complaint  Patient presents with  . Wound Check    HPI Robert Swanson is a 52 y.o. male.  HPI    52 year old male with history of COPD, substance abuse presents with concern for pain, redness of swelling of the nose after pulling out nasal hairs.  Reports pulled out nose hairs and about 3 days ago began to develop pain to the nose and area above the lip. Severe pain, worsened by palpation.  Has redness and swelling to area.  No drainage.  No hx of prior abscess drainage in ED but does acknowledge hx of cellulitis.  NO fevers, no headche, no n/v, no double vision or other neurologic symptoms.  Tried ibuprofen without relief of pain.  Past Medical History:  Diagnosis Date  . Asthma   . Chronic back pain   . COPD (chronic obstructive pulmonary disease) (HCC)   . Vertigo     There are no active problems to display for this patient.   Past Surgical History:  Procedure Laterality Date  . APPENDECTOMY         Home Medications    Prior to Admission medications   Medication Sig Start Date End Date Taking? Authorizing Provider  albuterol (PROVENTIL HFA;VENTOLIN HFA) 108 (90 Base) MCG/ACT inhaler Inhale 1-2 puffs into the lungs every 6 (six) hours as needed for wheezing or shortness of breath. 06/26/17   Vanetta Mulders, MD  clindamycin (CLEOCIN) 150 MG capsule Take 2 capsules (300 mg total) by mouth 3 (three) times daily for 10 days. 02/22/18 03/04/18  Alvira Monday, MD  traMADol (ULTRAM) 50 MG tablet Take 1 tablet (50 mg total) by mouth every 6 (six) hours as needed for severe pain (breakthrough pain). 02/22/18   Alvira Monday, MD    Family History No family history on file.  Social History Social History   Tobacco Use  . Smoking status: Current Every Day Smoker    Packs/day: 1.50    Types: Cigarettes  . Smokeless  tobacco: Never Used  Substance Use Topics  . Alcohol use: No    Frequency: Never  . Drug use: Yes    Types: Marijuana, Cocaine    Comment: denies current use     Allergies   Amoxicillin   Review of Systems Review of Systems  Constitutional: Negative for fever.  HENT: Negative for sore throat.   Eyes: Negative for visual disturbance.  Respiratory: Negative for shortness of breath.   Cardiovascular: Negative for chest pain.  Gastrointestinal: Negative for abdominal pain, nausea and vomiting.  Genitourinary: Negative for difficulty urinating.  Musculoskeletal: Negative for back pain and neck stiffness.  Skin: Positive for rash.  Neurological: Negative for syncope and headaches.     Physical Exam Updated Vital Signs BP 132/71 (BP Location: Right Arm)   Pulse 82   Temp 97.6 F (36.4 C) (Oral)   Resp 20   SpO2 100%   Physical Exam  Constitutional: He is oriented to person, place, and time. He appears well-developed and well-nourished. No distress.  HENT:  Head: Normocephalic and atraumatic.  2cm firm induration just inferior to nose with erythema Induration to nares, nasal turbinates, no sign of fluctuance/abscess in nares   Eyes: Conjunctivae and EOM are normal.  Neck: Normal range of motion.  Cardiovascular: Normal rate, regular rhythm, normal heart sounds and intact distal pulses. Exam reveals no  gallop and no friction rub.  No murmur heard. Pulmonary/Chest: Effort normal and breath sounds normal. No respiratory distress. He has no wheezes. He has no rales.  Abdominal: Soft. He exhibits no distension. There is no tenderness. There is no guarding.  Musculoskeletal: He exhibits no edema.  Lymphadenopathy:    He has cervical adenopathy.  Neurological: He is alert and oriented to person, place, and time.  Skin: Skin is warm and dry. He is not diaphoretic.  Nursing note and vitals reviewed.    ED Treatments / Results  Labs (all labs ordered are listed, but only  abnormal results are displayed) Labs Reviewed - No data to display  EKG  EKG Interpretation None       Radiology No results found.  Procedures Procedures (including critical care time)  Medications Ordered in ED Medications  clindamycin (CLEOCIN) capsule 300 mg (300 mg Oral Given 02/21/18 2349)  lidocaine (XYLOCAINE) 2 % (with pres) injection 100 mg (100 mg Infiltration Given 02/22/18 0027)     Initial Impression / Assessment and Plan / ED Course  I have reviewed the triage vital signs and the nursing notes.  Pertinent labs & imaging results that were available during my care of the patient were reviewed by me and considered in my medical decision making (see chart for details).      52 year old male with history of COPD, substance abuse presents with concern for pain, redness of swelling of the nose after pulling out nasal hairs.  Exam shows erythema of nose, no sign of intranasal abscess but does have significant induration, in addition has severe induration to filtrum. Initially concern for possible abscess however after injecting with lidocaine and aspirating with needle unable to detect abscess and suspect that area seen on US likely edema in setting of cellulitis and do not feel further incision indicated at this time by exam.    No systemic symptoms, no headaches, no double vision, no fevers. Patient appropriate for trial of outpatient abx. Recommend close follow up for cellulitis check given location.  Given rx for clindamycin. Also provided a rx for 6 tramadol for breakthrough pain but recommended ibuprofen/tylenol primarily.  Pt to return in 48 hours for recheck.     Final Clinical Impressions(s) / ED Diagnoses   Final diagnoses:  Facial cellulitis  Cellulitis of nose    ED Discharge Orders        Ordered    clindamycin (CLEOCIN) 150 MG capsule  3 times daily     02/22/18 0021    traMADol (ULTRAM) 50 MG tablet  Every 6 hours PRN     02/22/18 0021         Alvira MondaySchlossman, Ly Bacchi, MD 02/22/18 [id number]

## 2018-02-22 NOTE — Discharge Instructions (Signed)
Take Tylenol 1000 mg 4 times a day for 1 week. This is the maximum dose of Tylenol usually take from all sources. Please check other over-the-counter medications and prescriptions to ensure you are not taking other medications that contain acetaminophen.  You may also take ibuprofen 400 mg 6 times a day alternating with or at the same time as tylenol.  Take tramadol as needed for breakthrough pain.  This medication can be addicting, sedating and cause constipation.   °

## 2018-02-22 NOTE — ED Notes (Signed)
Pt given d/c instructions as per chart. Rx x 2 with precautions. Verbalizes understanding. No questions. 

## 2018-02-26 ENCOUNTER — Emergency Department (HOSPITAL_BASED_OUTPATIENT_CLINIC_OR_DEPARTMENT_OTHER): Payer: Self-pay

## 2018-02-26 ENCOUNTER — Other Ambulatory Visit: Payer: Self-pay

## 2018-02-26 ENCOUNTER — Encounter (HOSPITAL_BASED_OUTPATIENT_CLINIC_OR_DEPARTMENT_OTHER): Payer: Self-pay | Admitting: Emergency Medicine

## 2018-02-26 ENCOUNTER — Emergency Department (HOSPITAL_BASED_OUTPATIENT_CLINIC_OR_DEPARTMENT_OTHER)
Admission: EM | Admit: 2018-02-26 | Discharge: 2018-02-26 | Disposition: A | Discharge status: Home / Self Care | Payer: Self-pay | Attending: Emergency Medicine | Admitting: Emergency Medicine

## 2018-02-26 DIAGNOSIS — J441 Chronic obstructive pulmonary disease with (acute) exacerbation: Secondary | ICD-10-CM | POA: Insufficient documentation

## 2018-02-26 DIAGNOSIS — R42 Dizziness and giddiness: Secondary | ICD-10-CM | POA: Insufficient documentation

## 2018-02-26 DIAGNOSIS — F17210 Nicotine dependence, cigarettes, uncomplicated: Secondary | ICD-10-CM | POA: Insufficient documentation

## 2018-02-26 DIAGNOSIS — F1910 Other psychoactive substance abuse, uncomplicated: Secondary | ICD-10-CM | POA: Insufficient documentation

## 2018-02-26 HISTORY — DX: Other psychoactive substance abuse, uncomplicated: F19.10

## 2018-02-26 LAB — CBC WITH DIFFERENTIAL/PLATELET
Basophils Absolute: 0.1 10*3/uL (ref 0.0–0.1)
Basophils Relative: 1 %
Eosinophils Absolute: 1.2 10*3/uL — ABNORMAL HIGH (ref 0.0–0.7)
Eosinophils Relative: 11 %
HCT: 41.7 % (ref 39.0–52.0)
Hemoglobin: 13.6 g/dL (ref 13.0–17.0)
Lymphocytes Relative: 17 %
Lymphs Abs: 1.8 10*3/uL (ref 0.7–4.0)
MCH: 29.4 pg (ref 26.0–34.0)
MCHC: 32.6 g/dL (ref 30.0–36.0)
MCV: 90.3 fL (ref 78.0–100.0)
Monocytes Absolute: 0.7 10*3/uL (ref 0.1–1.0)
Monocytes Relative: 7 %
Neutro Abs: 6.6 10*3/uL (ref 1.7–7.7)
Neutrophils Relative %: 64 %
Platelets: 410 10*3/uL — ABNORMAL HIGH (ref 150–400)
RBC: 4.62 MIL/uL (ref 4.22–5.81)
RDW: 13.6 % (ref 11.5–15.5)
WBC: 10.2 10*3/uL (ref 4.0–10.5)

## 2018-02-26 LAB — COMPREHENSIVE METABOLIC PANEL
ALT: 12 U/L — ABNORMAL LOW (ref 17–63)
AST: 15 U/L (ref 15–41)
Albumin: 4 g/dL (ref 3.5–5.0)
Alkaline Phosphatase: 65 U/L (ref 38–126)
Anion gap: 8 (ref 5–15)
BUN: 23 mg/dL — ABNORMAL HIGH (ref 6–20)
CO2: 29 mmol/L (ref 22–32)
Calcium: 8.9 mg/dL (ref 8.9–10.3)
Chloride: 99 mmol/L — ABNORMAL LOW (ref 101–111)
Creatinine, Ser: 0.9 mg/dL (ref 0.61–1.24)
GFR calc Af Amer: >60 mL/min (ref >60)
GFR calc non Af Amer: >60 mL/min (ref >60)
Glucose, Bld: 112 mg/dL — ABNORMAL HIGH (ref 65–99)
Potassium: 4.2 mmol/L (ref 3.5–5.1)
Sodium: 136 mmol/L (ref 135–145)
Total Bilirubin: 0.4 mg/dL (ref 0.3–1.2)
Total Protein: 7.3 g/dL (ref 6.5–8.1)

## 2018-02-26 LAB — URINALYSIS, ROUTINE W REFLEX MICROSCOPIC
Bilirubin Urine: NEGATIVE
Glucose, UA: NEGATIVE mg/dL
Hgb urine dipstick: NEGATIVE
Ketones, ur: NEGATIVE mg/dL
Leukocytes, UA: NEGATIVE
Nitrite: NEGATIVE
Protein, ur: NEGATIVE mg/dL
Specific Gravity, Urine: 1.01 (ref 1.005–1.030)
pH: 7.5 (ref 5.0–8.0)

## 2018-02-26 LAB — RAPID URINE DRUG SCREEN, HOSP PERFORMED
Amphetamines: POSITIVE — AB
Barbiturates: NOT DETECTED
Benzodiazepines: NOT DETECTED
Cocaine: POSITIVE — AB
Opiates: POSITIVE — AB
Tetrahydrocannabinol: NOT DETECTED

## 2018-02-26 LAB — ETHANOL: Alcohol, Ethyl (B): <10 mg/dL (ref <10)

## 2018-02-26 LAB — LIPASE, BLOOD: Lipase: 25 U/L (ref 11–51)

## 2018-02-26 LAB — TROPONIN I: Troponin I: <0.03 ng/mL (ref <0.03)

## 2018-02-26 LAB — CBG MONITORING, ED: Glucose-Capillary: 98 mg/dL (ref 65–99)

## 2018-02-26 MED ORDER — MECLIZINE HCL 12.5 MG PO TABS: 12.5000 mg | ORAL_TABLET | Freq: Three times a day (TID) | ORAL | 0 refills | Status: DC | PRN

## 2018-02-26 MED ORDER — DEXAMETHASONE SODIUM PHOSPHATE 10 MG/ML IJ SOLN
10.0000 mg | Freq: Once | INTRAMUSCULAR | Status: AC
  Administered 2018-02-26: 10 mg via INTRAVENOUS
  Filled 2018-02-26: qty 1

## 2018-02-26 MED ORDER — SODIUM CHLORIDE 0.9 % IV BOLUS (SEPSIS)
1000.0000 mL | Freq: Once | INTRAVENOUS | Status: AC
  Administered 2018-02-26: 1000 mL via INTRAVENOUS

## 2018-02-26 MED ORDER — IPRATROPIUM-ALBUTEROL 0.5-2.5 (3) MG/3ML IN SOLN
3.0000 mL | Freq: Once | RESPIRATORY_TRACT | Status: AC
  Administered 2018-02-26: 3 mL via RESPIRATORY_TRACT
  Filled 2018-02-26: qty 3

## 2018-02-26 MED ORDER — ALBUTEROL SULFATE HFA 108 (90 BASE) MCG/ACT IN AERS
1.0000 | INHALATION_SPRAY | Freq: Once | RESPIRATORY_TRACT | Status: AC
  Administered 2018-02-26: 1 via RESPIRATORY_TRACT
  Filled 2018-02-26: qty 6.7

## 2018-02-26 MED ORDER — MECLIZINE HCL 25 MG PO TABS
12.5000 mg | ORAL_TABLET | Freq: Once | ORAL | Status: AC
  Administered 2018-02-26: 12.5 mg via ORAL
  Filled 2018-02-26: qty 1

## 2018-02-26 NOTE — ED Notes (Signed)
Patient now alert and speaking with staff appropriately.

## 2018-02-26 NOTE — ED Provider Notes (Signed)
[facility] EMERGENCY DEPARTMENT Provider Note   CSN: [id number] Arrival date & time: 02/26/18  1912     History   Chief Complaint Chief Complaint  Patient presents with  . Altered Mental Status    HPI Robert Swanson is a 52 y.o. male with a past medical history of COPD, polysubstance abuse, who presents to ED for evaluation of dizziness, nausea, 2 episodes of NBNB emesis, and shortness of breath.  Symptoms began approximately 7 hours prior to arrival.  He states that he is sleepy because of the dizziness.  Feels as though he in the room are both spinning.  He has tried his home inhalers with no improvement in his shortness of breath.  Denies any alcohol or drug use prior to arrival.  Denies any head injuries or falls, vision changes, chest pain, abdominal pain, diarrhea, changes in medications or food intake, fever, cough, hemoptysis, numbness in arms or legs.  He does report some urinary discomfort for the past several days.  HPI  Past Medical History:  Diagnosis Date  . Asthma   . Chronic back pain   . COPD (chronic obstructive pulmonary disease) (HCC)   . Polysubstance abuse (HCC)   . Vertigo     There are no active problems to display for this patient.   Past Surgical History:  Procedure Laterality Date  . APPENDECTOMY         Home Medications    Prior to Admission medications   Medication Sig Start Date End Date Taking? Authorizing Provider  albuterol (PROVENTIL HFA;VENTOLIN HFA) 108 (90 Base) MCG/ACT inhaler Inhale 1-2 puffs into the lungs every 6 (six) hours as needed for wheezing or shortness of breath. 06/26/17   Vanetta MuldersZackowski, Scott, MD  clindamycin (CLEOCIN) 150 MG capsule Take 2 capsules (300 mg total) by mouth 3 (three) times daily for 10 days. 02/22/18 03/04/18  Alvira MondaySchlossman, Erin, MD  meclizine (ANTIVERT) 12.5 MG tablet Take 1 tablet (12.5 mg total) by mouth 3 (three) times daily as needed for dizziness. 02/26/18   Gerlene Glassburn, PA-C  traMADol (ULTRAM)  50 MG tablet Take 1 tablet (50 mg total) by mouth every 6 (six) hours as needed for severe pain (breakthrough pain). 02/22/18   Alvira MondaySchlossman, Erin, MD    Family History History reviewed. No pertinent family history.  Social History Social History   Tobacco Use  . Smoking status: Current Every Day Smoker    Packs/day: 1.50    Types: Cigarettes  . Smokeless tobacco: Never Used  Substance Use Topics  . Alcohol use: No    Frequency: Never  . Drug use: Yes    Types: Marijuana, Cocaine, Methamphetamines    Comment: denies current use     Allergies   Amoxicillin   Review of Systems Review of Systems  Constitutional: Negative for appetite change, chills and fever.  HENT: Negative for ear pain, rhinorrhea, sneezing and sore throat.   Eyes: Negative for photophobia and visual disturbance.  Respiratory: Positive for shortness of breath and wheezing. Negative for cough and chest tightness.   Cardiovascular: Negative for chest pain and palpitations.  Gastrointestinal: Positive for vomiting. Negative for abdominal pain, blood in stool, constipation, diarrhea and nausea.  Genitourinary: Negative for dysuria, hematuria and urgency.  Musculoskeletal: Negative for myalgias.  Skin: Negative for rash.  Neurological: Positive for dizziness. Negative for weakness and light-headedness.     Physical Exam Updated Vital Signs BP (!) 131/92   Pulse 81   Temp (!) 97.5 F (36.4 C) (Oral)  Resp (!) 22   Ht 5\' 4"  (1.626 m)   Wt 81.6 kg (180 lb)   SpO2 97%   BMI 30.90 kg/m   Physical Exam  Constitutional: He is oriented to person, place, and time. He appears well-developed and well-nourished. No distress.  Nontoxic appearing and in no acute distress. Alert, oriented but drowsy. Will intermittently close eyes but easily arousable.  HENT:  Head: Normocephalic and atraumatic.  Nose: Nose normal.  Eyes: Conjunctivae and EOM are normal. Pupils are equal, round, and reactive to light. Right eye  exhibits no discharge. Left eye exhibits no discharge. No scleral icterus.  Neck: Normal range of motion. Neck supple.  Cardiovascular: Normal rate, regular rhythm, normal heart sounds and intact distal pulses. Exam reveals no gallop and no friction rub.  No murmur heard. Pulmonary/Chest: Effort normal. No respiratory distress. He has wheezes in the right upper field, the right middle field, the left upper field and the left middle field.  Abdominal: Soft. Bowel sounds are normal. He exhibits no distension. There is no tenderness. There is no guarding.  Musculoskeletal: Normal range of motion. He exhibits no edema.  Neurological: He is alert and oriented to person, place, and time. No cranial nerve deficit or sensory deficit. He exhibits normal muscle tone. Coordination normal.  Pupils reactive. No facial asymmetry noted. Cranial nerves appear grossly intact. Sensation intact to light touch on face, BUE and BLE. Strength 5/5 in BUE and BLE.  Skin: Skin is warm and dry. No rash noted.  Psychiatric: He has a normal mood and affect.  Nursing note and vitals reviewed.    ED Treatments / Results  Labs (all labs ordered are listed, but only abnormal results are displayed) Labs Reviewed  CBC WITH DIFFERENTIAL/PLATELET - Abnormal; Notable for the following components:      Result Value   Platelets 410 (*)    Eosinophils Absolute 1.2 (*)    All other components within normal limits  COMPREHENSIVE METABOLIC PANEL - Abnormal; Notable for the following components:   Chloride 99 (*)    Glucose, Bld 112 (*)    BUN 23 (*)    ALT 12 (*)    All other components within normal limits  RAPID URINE DRUG SCREEN, HOSP PERFORMED - Abnormal; Notable for the following components:   Opiates POSITIVE (*)    Cocaine POSITIVE (*)    Amphetamines POSITIVE (*)    All other components within normal limits  LIPASE, BLOOD  ETHANOL  TROPONIN I  URINALYSIS, ROUTINE W REFLEX MICROSCOPIC  CBG MONITORING, ED     EKG  EKG Interpretation  Date/Time:  Friday February 26 2018 19:47:53 EDT Ventricular Rate:  69 PR Interval:    QRS Duration: 110 QT Interval:  404 QTC Calculation: 433 R Axis:   94 Text Interpretation:  Sinus rhythm Borderline right axis deviation Low voltage, precordial leads Baseline wander in lead(s) V5 V6 No significant change since last tracing Confirmed by Gwyneth Sprout ([id number]) on 02/26/2018 10:37:56 PM       Radiology Dg Chest 2 View  Result Date: 02/26/2018 CLINICAL DATA:  Wheezing EXAM: CHEST - 2 VIEW COMPARISON:  08/24/2017 chest radiograph. FINDINGS: Stable cardiomediastinal silhouette with normal heart size. No pneumothorax. No pleural effusion. Mildly hyperinflated lungs. No pulmonary edema. No acute consolidative airspace disease. IMPRESSION: Mildly hyperinflated lungs, suggesting COPD. No acute cardiopulmonary disease. Electronically Signed   By: Delbert Phenix M.D.   On: 02/26/2018 20:42   Ct Head Wo Contrast  Result Date: 02/26/2018 CLINICAL  DATA:  52 year old male with dizziness, lethargy, altered mental status. EXAM: CT HEAD WITHOUT CONTRAST TECHNIQUE: Contiguous axial images were obtained from the base of the skull through the vertex without intravenous contrast. COMPARISON:  Head CT 06/26/2017 and earlier. FINDINGS: Brain: Stable cerebral volume. No midline shift, ventriculomegaly, mass effect, evidence of mass lesion, intracranial hemorrhage or evidence of cortically based acute infarction. Gray-white matter differentiation is within normal limits throughout the brain. Vascular: No suspicious intracranial vascular hyperdensity. Skull: Stable, negative. Sinuses/Orbits: Visualized paranasal sinuses and mastoids are stable and well pneumatized. Other: Visualized orbits and scalp soft tissues are within normal limits. IMPRESSION: Stable and normal noncontrast Head CT. Electronically Signed   By: Odessa Fleming M.D.   On: 02/26/2018 20:35    Procedures Procedures (including  critical care time)  Medications Ordered in ED Medications  meclizine (ANTIVERT) tablet 12.5 mg (not administered)  albuterol (PROVENTIL HFA;VENTOLIN HFA) 108 (90 Base) MCG/ACT inhaler 1 puff (not administered)  ipratropium-albuterol (DUONEB) 0.5-2.5 (3) MG/3ML nebulizer solution 3 mL (3 mLs Nebulization Given 02/26/18 1952)  sodium chloride 0.9 % bolus 1,000 mL (0 mLs Intravenous Stopped 02/26/18 2156)  dexamethasone (DECADRON) injection 10 mg (10 mg Intravenous Given 02/26/18 2156)     Initial Impression / Assessment and Plan / ED Course  I have reviewed the triage vital signs and the nursing notes.  Pertinent labs & imaging results that were available during my care of the patient were reviewed by me and considered in my medical decision making (see chart for details).     Patient presents to ED for evaluation of dizziness, nausea, 2 episodes of nonbloody, nonbilious emesis and shortness of breath.  Symptoms began approximately 7 hours prior to arrival.  On physical exam patient had no deficits on neurological examination.  He did have wheezing diffusely in bilateral lung fields with no signs of respiratory distress noted.  He was alert, oriented and arousable however, was falling asleep during my evaluation did appear drowsy.  He does have a history of polysubstance abuse.  His lab work including CBC, CMP, lipase, troponin negative.  Ethanol level within normal limits.  CBG normal.  Urinalysis with no evidence of UTI.  CT of the head negative.  Chest x-ray did show findings consistent with COPD but no acute abnormalities.  EKG with no ischemic changes or changes from prior tracings.  UDS positive for opiates, cocaine and amphetamines.  He does have a history of opiate and cocaine positive on UDS in the past however not amphetamines.  I suspect that this is probably what is causing him to have the drowsiness and dizziness.  He reports improvement in his symptoms with nebulizer treatment and  Decadron given here in the ED.  He request something for his vertigo.  Will give meclizine, refill for inhaler and advised him to follow-up with PCP for further evaluation.  Vital signs within normal limits and patient is able to tolerate p.o. intake and ambulate without difficulty.  He states that he is ready to go home.  Patient appears stable for discharge at this time.  Strict return precautions given.  Portions of this note were generated with Scientist, clinical (histocompatibility and immunogenetics). Dictation errors may occur despite best attempts at proofreading.   Final Clinical Impressions(s) / ED Diagnoses   Final diagnoses:  COPD exacerbation (HCC)  Vertigo  Polysubstance abuse [facility])    ED Discharge Orders        Ordered    meclizine (ANTIVERT) 12.5 MG tablet  3 times daily PRN  02/26/18 2241       Dietrich Pates, PA-C 02/26/18 2244    Gwyneth Sprout, MD 02/28/18 2019

## 2018-02-26 NOTE — ED Notes (Signed)
Pt verbalizes understanding of d/c instructions and denies any further needs at this time. 

## 2018-02-26 NOTE — ED Notes (Signed)
Pt quickly walked around the department without any difficulty

## 2018-02-26 NOTE — ED Triage Notes (Signed)
Patient states that he is brought in by a friend Patient cant keep his eyes open and he falls a sleep. He reports that this because of the dizziness. The patient is sluggish to respond and has slurred speech  - denies any ETOH or Drug usage prior to arrival

## 2018-02-26 NOTE — ED Triage Notes (Signed)
Patient states that he has had dizziness and nausea for the last few hours  - the patient also reports that he is SOB

## 2018-02-26 NOTE — ED Notes (Signed)
Lights turned on, bed raised and pt given water and encouraged to drink. Pt made aware that his is going to be discharged soon and should start finding a ride.

## 2018-05-28 ENCOUNTER — Emergency Department (HOSPITAL_BASED_OUTPATIENT_CLINIC_OR_DEPARTMENT_OTHER): Payer: Self-pay

## 2018-05-28 ENCOUNTER — Encounter (HOSPITAL_BASED_OUTPATIENT_CLINIC_OR_DEPARTMENT_OTHER): Payer: Self-pay

## 2018-05-28 ENCOUNTER — Other Ambulatory Visit: Payer: Self-pay

## 2018-05-28 ENCOUNTER — Emergency Department (HOSPITAL_BASED_OUTPATIENT_CLINIC_OR_DEPARTMENT_OTHER)
Admission: EM | Admit: 2018-05-28 | Discharge: 2018-05-28 | Discharge status: Against Medical Advice | Payer: Self-pay | Attending: Emergency Medicine | Admitting: Emergency Medicine

## 2018-05-28 DIAGNOSIS — Z5321 Procedure and treatment not carried out due to patient leaving prior to being seen by health care provider: Secondary | ICD-10-CM | POA: Insufficient documentation

## 2018-05-28 DIAGNOSIS — J431 Panlobular emphysema: Secondary | ICD-10-CM | POA: Insufficient documentation

## 2018-05-28 DIAGNOSIS — F17210 Nicotine dependence, cigarettes, uncomplicated: Secondary | ICD-10-CM | POA: Insufficient documentation

## 2018-05-28 MED ORDER — ALBUTEROL SULFATE (2.5 MG/3ML) 0.083% IN NEBU
2.5000 mg | INHALATION_SOLUTION | Freq: Once | RESPIRATORY_TRACT | Status: AC
  Administered 2018-05-28: 2.5 mg via RESPIRATORY_TRACT
  Filled 2018-05-28: qty 3

## 2018-05-28 MED ORDER — ALBUTEROL SULFATE HFA 108 (90 BASE) MCG/ACT IN AERS
1.0000 | INHALATION_SPRAY | Freq: Once | RESPIRATORY_TRACT | Status: AC
  Administered 2018-05-28: 2 via RESPIRATORY_TRACT
  Filled 2018-05-28: qty 6.7

## 2018-05-28 MED ORDER — LEVALBUTEROL TARTRATE 45 MCG/ACT IN AERO: 1.0000 | INHALATION_SPRAY | Freq: Four times a day (QID) | RESPIRATORY_TRACT | 0 refills | Status: DC | PRN

## 2018-05-28 MED ORDER — DEXAMETHASONE SODIUM PHOSPHATE 10 MG/ML IJ SOLN
10.0000 mg | Freq: Once | INTRAMUSCULAR | Status: AC
  Administered 2018-05-28: 10 mg via INTRAMUSCULAR
  Filled 2018-05-28: qty 1

## 2018-05-28 MED ORDER — IPRATROPIUM-ALBUTEROL 0.5-2.5 (3) MG/3ML IN SOLN
3.0000 mL | Freq: Once | RESPIRATORY_TRACT | Status: AC
Start: 2018-05-28 — End: 2018-05-28
  Administered 2018-05-28: 3 mL via RESPIRATORY_TRACT
  Filled 2018-05-28: qty 3

## 2018-05-28 MED ORDER — ALBUTEROL SULFATE (2.5 MG/3ML) 0.083% IN NEBU: 5.0000 mg | INHALATION_SOLUTION | Freq: Once | RESPIRATORY_TRACT | Status: DC

## 2018-05-28 MED ORDER — IPRATROPIUM BROMIDE HFA 17 MCG/ACT IN AERS: 2.0000 | INHALATION_SPRAY | Freq: Four times a day (QID) | RESPIRATORY_TRACT | 0 refills | Status: DC | PRN

## 2018-05-28 NOTE — ED Provider Notes (Signed)
[facility] EMERGENCY DEPARTMENT Provider Note   CSN: [id number] Arrival date & time: 05/28/18  0530     History   Chief Complaint Chief Complaint  Patient presents with  . Shortness of Breath    HPI Robert Swanson is a 52 y.o. male.  The history is provided by the patient.  Shortness of Breath  This is a recurrent problem. The average episode lasts 1 day. The problem occurs continuously.The current episode started yesterday. The problem has not changed since onset.Associated symptoms include wheezing. Pertinent negatives include no fever, no headaches, no coryza, no rhinorrhea, no sore throat, no swollen glands, no ear pain, no neck pain, no cough, no sputum production, no hemoptysis, no PND, no orthopnea, no chest pain, no syncope, no vomiting, no abdominal pain, no rash, no leg pain, no leg swelling and no claudication. The problem's precipitants include smoke. Risk factors include smoking. He has tried nothing for the symptoms. The treatment provided no relief. He has had prior ED visits. He has had no prior ICU admissions. Associated medical issues include COPD.  Is refusing Xray, labs and EKG.    Past Medical History:  Diagnosis Date  . Asthma   . Chronic back pain   . COPD (chronic obstructive pulmonary disease) (HCC)   . Polysubstance abuse (HCC)   . Vertigo     There are no active problems to display for this patient.   Past Surgical History:  Procedure Laterality Date  . APPENDECTOMY          Home Medications    Prior to Admission medications   Medication Sig Start Date End Date Taking? Authorizing Provider  albuterol (PROVENTIL HFA;VENTOLIN HFA) 108 (90 Base) MCG/ACT inhaler Inhale 1-2 puffs into the lungs every 6 (six) hours as needed for wheezing or shortness of breath. 06/26/17   Vanetta MuldersZackowski, Scott, MD  meclizine (ANTIVERT) 12.5 MG tablet Take 1 tablet (12.5 mg total) by mouth 3 (three) times daily as needed for dizziness. 02/26/18   Khatri, Hina,  PA-C  traMADol (ULTRAM) 50 MG tablet Take 1 tablet (50 mg total) by mouth every 6 (six) hours as needed for severe pain (breakthrough pain). 02/22/18   Alvira MondaySchlossman, Erin, MD    Family History History reviewed. No pertinent family history.  Social History Social History   Tobacco Use  . Smoking status: Current Every Day Smoker    Packs/day: 1.50    Types: Cigarettes  . Smokeless tobacco: Never Used  Substance Use Topics  . Alcohol use: No    Frequency: Never  . Drug use: Yes    Types: Marijuana, Cocaine, Methamphetamines    Comment: denies current use     Allergies   Amoxicillin   Review of Systems Review of Systems  Constitutional: Negative for diaphoresis and fever.  HENT: Negative for ear pain, rhinorrhea and sore throat.   Respiratory: Positive for shortness of breath and wheezing. Negative for cough, hemoptysis, sputum production, choking and stridor.   Cardiovascular: Negative for chest pain, palpitations, orthopnea, claudication, leg swelling, syncope and PND.  Gastrointestinal: Negative for abdominal pain and vomiting.  Musculoskeletal: Negative for neck pain.  Skin: Negative for rash.  Neurological: Negative for headaches.  All other systems reviewed and are negative.    Physical Exam Updated Vital Signs BP 132/76 (BP Location: Left Arm)   Pulse 96   Temp 97.9 F (36.6 C) (Oral)   Resp 20   Ht 5\' 4"  (1.626 m)   SpO2 96%   BMI 30.90 kg/m  Physical Exam  Constitutional: He is oriented to person, place, and time. He appears well-developed and well-nourished.  HENT:  Head: Normocephalic and atraumatic.  Mouth/Throat: No oropharyngeal exudate.  Eyes: Pupils are equal, round, and reactive to light. Conjunctivae are normal.  Neck: Normal range of motion. Neck supple. No JVD present.  Cardiovascular: Normal rate, regular rhythm, normal heart sounds and intact distal pulses.  Pulmonary/Chest: No respiratory distress. He has wheezes. He has no rales. He  exhibits no tenderness.  Abdominal: Soft. Bowel sounds are normal. He exhibits no mass. There is no tenderness. There is no rebound and no guarding.  Musculoskeletal: Normal range of motion. He exhibits no edema or tenderness.  Neurological: He is alert and oriented to person, place, and time. He displays normal reflexes.  Skin: Skin is warm and dry. Capillary refill takes less than 2 seconds. He is not diaphoretic.     ED Treatments / Results  Labs (all labs ordered are listed, but only abnormal results are displayed) Labs Reviewed  BASIC METABOLIC PANEL  CBC  TROPONIN I    EKG EKG Interpretation  Date/Time:  Friday May 28 2018 05:45:47 EDT Ventricular Rate:  86 PR Interval:    QRS Duration: 109 QT Interval:  347 QTC Calculation: 415 R Axis:   -135 Text Interpretation:  Sinus rhythm Right axis deviation Confirmed by Nicanor Alcon, Charly Hunton ([id number]) on 05/28/2018 5:47:43 AM   Radiology No results found.  Procedures Procedures (including critical care time)  Medications Ordered in ED Medications  albuterol (PROVENTIL) (2.5 MG/3ML) 0.083% nebulizer solution 2.5 mg (has no administration in time range)  ipratropium-albuterol (DUONEB) 0.5-2.5 (3) MG/3ML nebulizer solution 3 mL (has no administration in time range)       Final Clinical Impressions(s) / ED Diagnoses   Patient is AO4 and has decision making capacity to refuse care and intervention.  He understands the risks of not having these tests.  He will sign ou against medical advice.  He understands the risks are but are not limited to: death, respiratory failure, coma, stroke, chronic shortness of breath, heart attack and prolonged morbidity secondary to untreated conditions.  He verbalizes understanding of these risks and is willing to accept all these risks and others.   He is welcomed to return at any time.     Marcine Gadway, MD 05/28/18 [id number]

## 2018-05-28 NOTE — Progress Notes (Signed)
RN gave patient albuterol inhaler. 

## 2018-05-28 NOTE — ED Notes (Signed)
Patient returned from X-ray 

## 2018-05-28 NOTE — ED Triage Notes (Signed)
Pt presents c/o sob, denies cp. Pt requesting breathing treatment only. Pt denies lab collection, EKG, and Chest XRay. Pt A+OX4, NAD, Palumbo, MD at bedside.

## 2018-06-08 IMAGING — CR DG CHEST 2V
3 series · 3 of 3 positions shown · non-contrast
Comparison: 08/07/2017, 07/19/2017, 06/26/2017

No displaced fracture

CLINICAL DATA: 51-year-old male with a history of shortness of
breath

EXAM:
CHEST  2 VIEW

[w chest lat (1 of 2)]
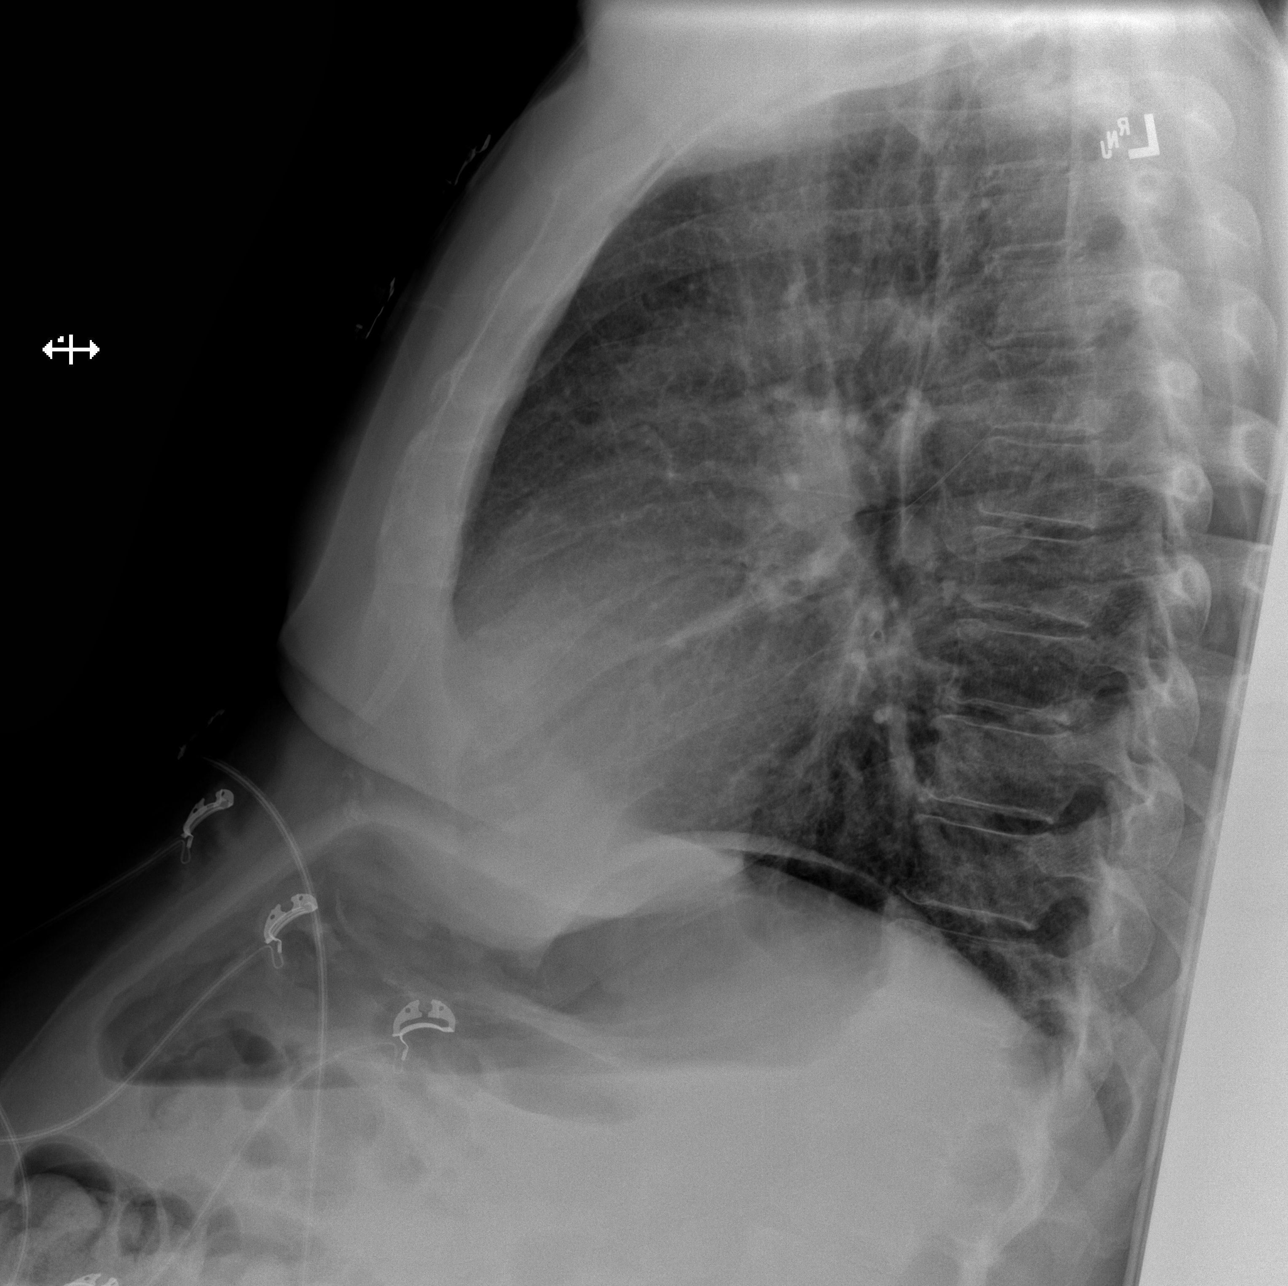

[w chest lat (2 of 2)]
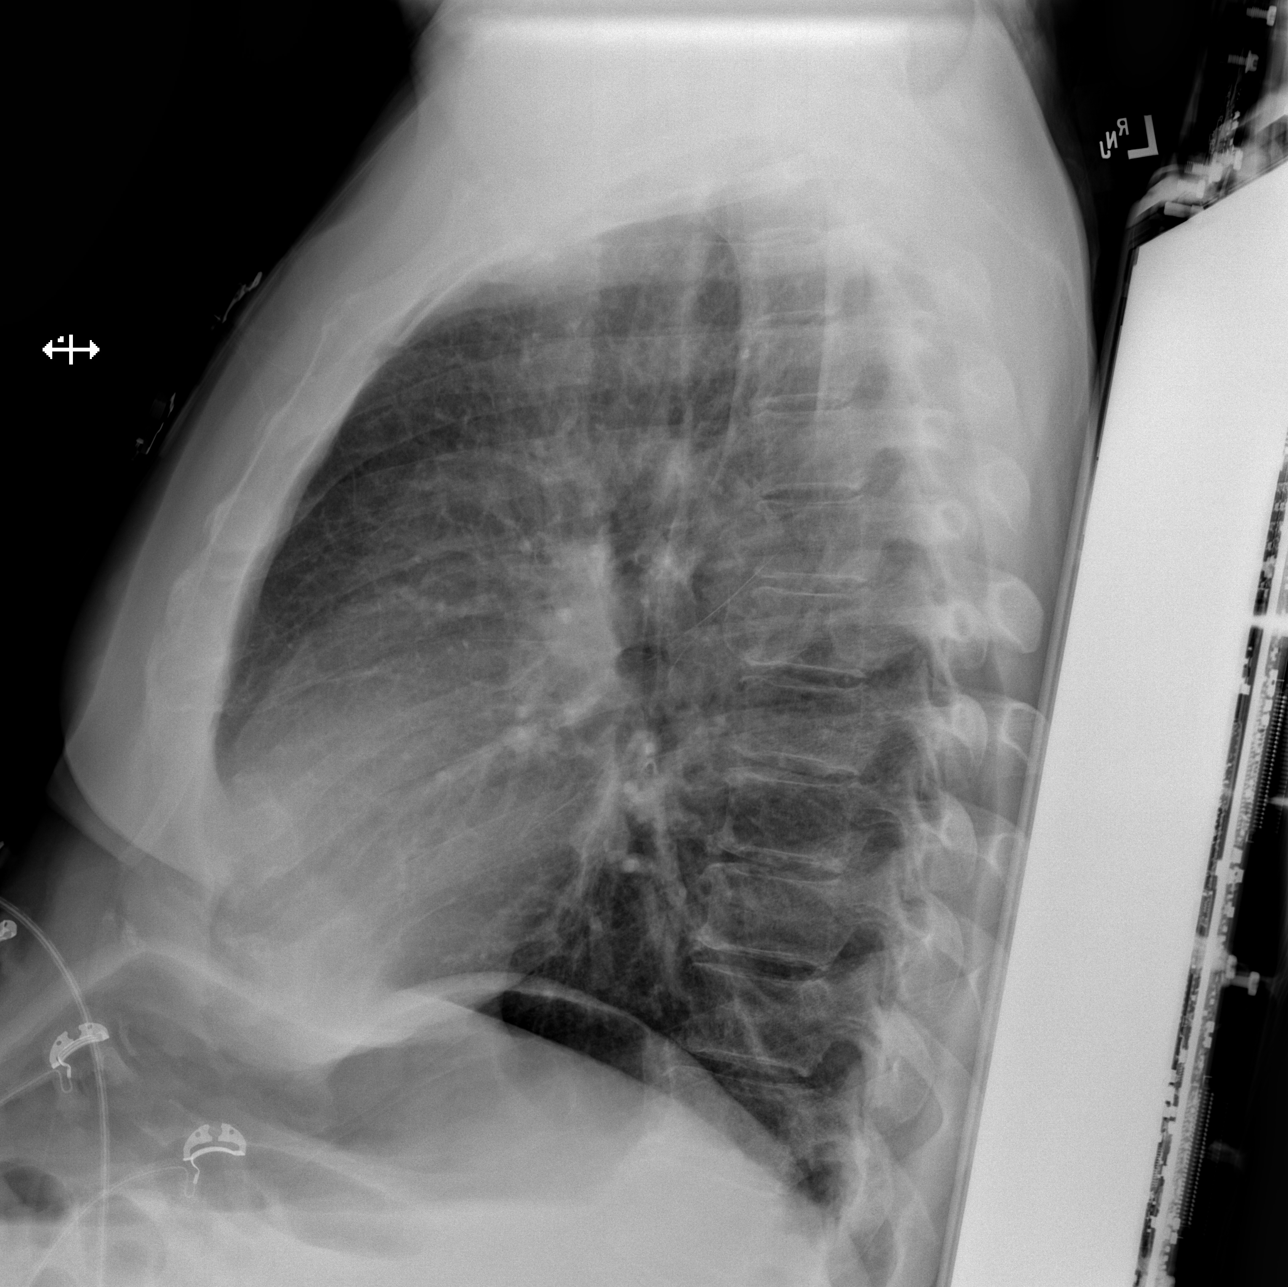

[x chest ap]
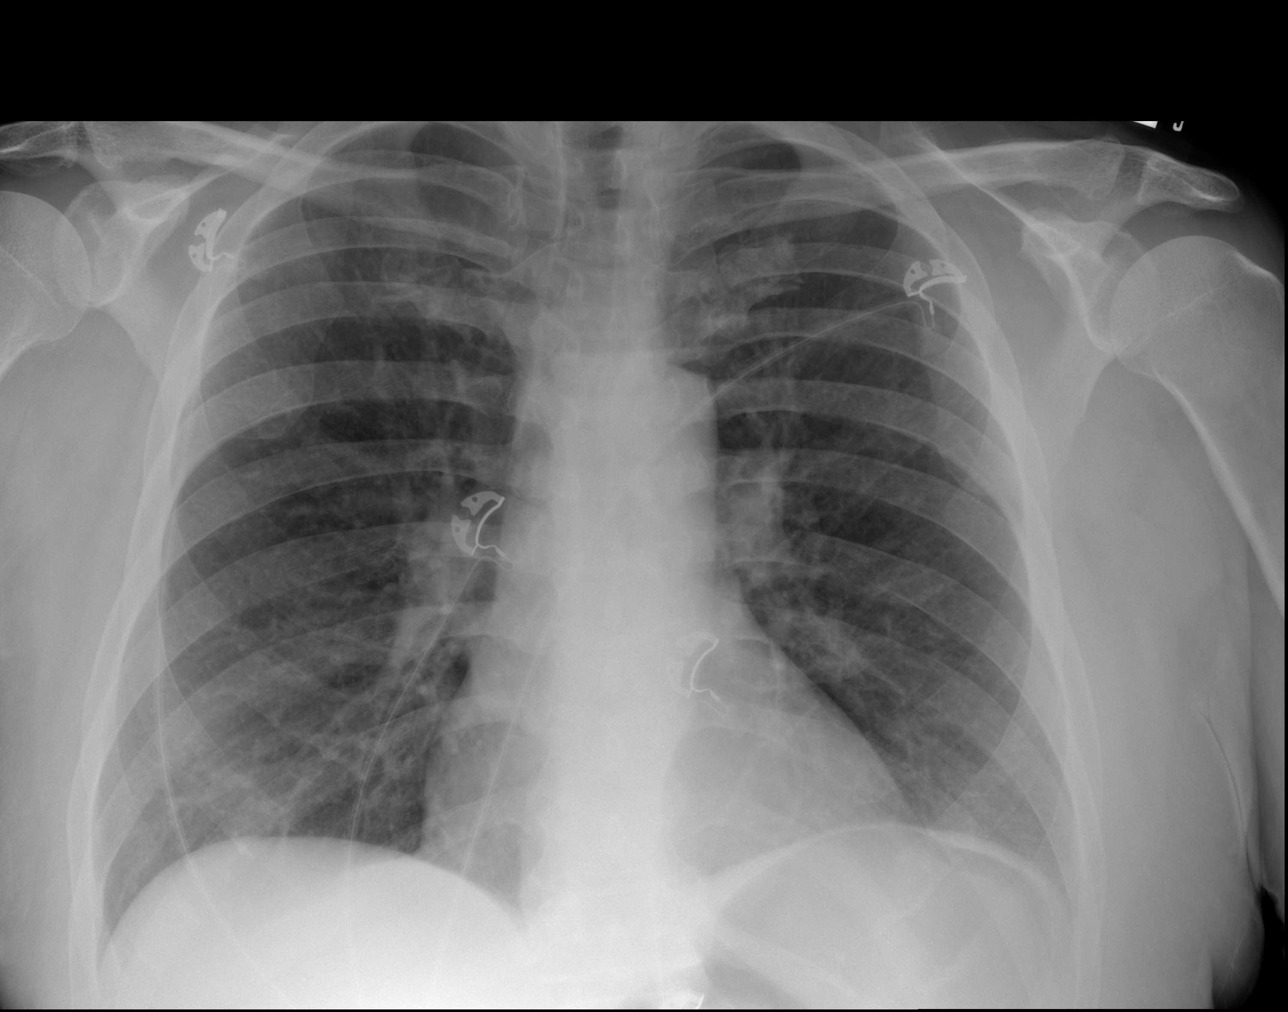

[3 of 3 positions shown; findings below may reference images not displayed]

FINDINGS: Cardiomediastinal silhouette unchanged in size and contour. No
evidence of central vascular congestion.

Ill-defined linear opacities of the bilateral lungs, present on
comparison chest x-rays. No evidence of pleural effusion or
confluent airspace disease.
IMPRESSION: Chronic lung changes without evidence of superimposed acute
cardiopulmonary disease.

## 2018-10-13 ENCOUNTER — Emergency Department (HOSPITAL_BASED_OUTPATIENT_CLINIC_OR_DEPARTMENT_OTHER): Payer: Self-pay

## 2018-10-13 ENCOUNTER — Emergency Department (HOSPITAL_BASED_OUTPATIENT_CLINIC_OR_DEPARTMENT_OTHER)
Admission: EM | Admit: 2018-10-13 | Discharge: 2018-10-13 | Disposition: A | Discharge status: Home / Self Care | Payer: Self-pay | Attending: Emergency Medicine | Admitting: Emergency Medicine

## 2018-10-13 ENCOUNTER — Other Ambulatory Visit: Payer: Self-pay

## 2018-10-13 ENCOUNTER — Encounter (HOSPITAL_BASED_OUTPATIENT_CLINIC_OR_DEPARTMENT_OTHER): Payer: Self-pay

## 2018-10-13 DIAGNOSIS — J069 Acute upper respiratory infection, unspecified: Secondary | ICD-10-CM | POA: Insufficient documentation

## 2018-10-13 DIAGNOSIS — Z79899 Other long term (current) drug therapy: Secondary | ICD-10-CM | POA: Insufficient documentation

## 2018-10-13 DIAGNOSIS — J449 Chronic obstructive pulmonary disease, unspecified: Secondary | ICD-10-CM | POA: Insufficient documentation

## 2018-10-13 DIAGNOSIS — F17210 Nicotine dependence, cigarettes, uncomplicated: Secondary | ICD-10-CM | POA: Insufficient documentation

## 2018-10-13 LAB — BASIC METABOLIC PANEL
Anion gap: 7 (ref 5–15)
BUN: 18 mg/dL (ref 6–20)
CO2: 28 mmol/L (ref 22–32)
Calcium: 8.9 mg/dL (ref 8.9–10.3)
Chloride: 103 mmol/L (ref 98–111)
Creatinine, Ser: 0.84 mg/dL (ref 0.61–1.24)
GFR calc Af Amer: >60 mL/min (ref >60)
GFR calc non Af Amer: >60 mL/min (ref >60)
Glucose, Bld: 89 mg/dL (ref 70–99)
Potassium: 4.1 mmol/L (ref 3.5–5.1)
Sodium: 138 mmol/L (ref 135–145)

## 2018-10-13 LAB — CBC WITH DIFFERENTIAL/PLATELET
Abs Immature Granulocytes: 0.04 10*3/uL (ref 0.00–0.07)
Basophils Absolute: 0 10*3/uL (ref 0.0–0.1)
Basophils Relative: 1 %
Eosinophils Absolute: 0.4 10*3/uL (ref 0.0–0.5)
Eosinophils Relative: 5 %
HCT: 44.4 % (ref 39.0–52.0)
Hemoglobin: 14.1 g/dL (ref 13.0–17.0)
Immature Granulocytes: 1 %
Lymphocytes Relative: 20 %
Lymphs Abs: 1.7 10*3/uL (ref 0.7–4.0)
MCH: 29.4 pg (ref 26.0–34.0)
MCHC: 31.8 g/dL (ref 30.0–36.0)
MCV: 92.5 fL (ref 80.0–100.0)
Monocytes Absolute: 0.5 10*3/uL (ref 0.1–1.0)
Monocytes Relative: 6 %
Neutro Abs: 5.7 10*3/uL (ref 1.7–7.7)
Neutrophils Relative %: 67 %
Platelets: 283 10*3/uL (ref 150–400)
RBC: 4.8 MIL/uL (ref 4.22–5.81)
RDW: 12.4 % (ref 11.5–15.5)
WBC: 8.4 10*3/uL (ref 4.0–10.5)
nRBC: 0 % (ref 0.0–0.2)

## 2018-10-13 MED ORDER — ALBUTEROL SULFATE (2.5 MG/3ML) 0.083% IN NEBU
2.5000 mg | INHALATION_SOLUTION | Freq: Once | RESPIRATORY_TRACT | Status: AC
  Administered 2018-10-13: 2.5 mg via RESPIRATORY_TRACT
  Filled 2018-10-13: qty 3

## 2018-10-13 MED ORDER — PREDNISONE 20 MG PO TABS
40.0000 mg | ORAL_TABLET | Freq: Once | ORAL | Status: AC
  Administered 2018-10-13: 40 mg via ORAL
  Filled 2018-10-13: qty 2

## 2018-10-13 MED ORDER — PREDNISONE 20 MG PO TABS: 40.0000 mg | ORAL_TABLET | Freq: Every day | ORAL | 0 refills | Status: DC

## 2018-10-13 MED ORDER — IPRATROPIUM-ALBUTEROL 0.5-2.5 (3) MG/3ML IN SOLN
3.0000 mL | Freq: Once | RESPIRATORY_TRACT | Status: AC
  Administered 2018-10-13: 3 mL via RESPIRATORY_TRACT
  Filled 2018-10-13: qty 3

## 2018-10-13 MED ORDER — ALBUTEROL SULFATE HFA 108 (90 BASE) MCG/ACT IN AERS
2.0000 | INHALATION_SPRAY | RESPIRATORY_TRACT | Status: DC | PRN
  Administered 2018-10-13: 2 via RESPIRATORY_TRACT
  Filled 2018-10-13: qty 6.7

## 2018-10-13 MED ORDER — SODIUM CHLORIDE 0.9 % IV BOLUS
500.0000 mL | Freq: Once | INTRAVENOUS | Status: AC
  Administered 2018-10-13: 500 mL via INTRAVENOUS

## 2018-10-13 NOTE — ED Provider Notes (Signed)
[facility] EMERGENCY DEPARTMENT Provider Note   CSN: [id number] Arrival date & time: 10/13/18  1218     History   Chief Complaint Chief Complaint  Patient presents with  . Cough    HPI Robert Swanson is a 52 y.o. male.  HPI Patient presents with cough.  States he feels dizzy.  Felt bad for the last couple days.  States he is a fever.  Cough with some minimal sputum production.  He has been out of his inhaler also for the last couple days.  States his mouth feels real dry and he feels dizzy when he stands up.  No chest pain.  No swelling in his legs.  No abdominal pain.  States he is worried that he may have diabetes because his family has it also. Past Medical History:  Diagnosis Date  . Asthma   . Chronic back pain   . COPD (chronic obstructive pulmonary disease) (HCC)   . Polysubstance abuse (HCC)   . Vertigo     There are no active problems to display for this patient.   Past Surgical History:  Procedure Laterality Date  . APPENDECTOMY          Home Medications    Prior to Admission medications   Medication Sig Start Date End Date Taking? Authorizing Provider  albuterol (PROVENTIL HFA;VENTOLIN HFA) 108 (90 Base) MCG/ACT inhaler Inhale 1-2 puffs into the lungs every 6 (six) hours as needed for wheezing or shortness of breath. 06/26/17   Vanetta Mulders, MD  ipratropium (ATROVENT HFA) 17 MCG/ACT inhaler Inhale 2 puffs into the lungs every 6 (six) hours as needed for wheezing. 05/28/18   Palumbo, April, MD  levalbuterol [facility] HFA) 45 MCG/ACT inhaler Inhale 1-2 puffs into the lungs every 6 (six) hours as needed for wheezing. 05/28/18   Palumbo, April, MD  meclizine (ANTIVERT) 12.5 MG tablet Take 1 tablet (12.5 mg total) by mouth 3 (three) times daily as needed for dizziness. 02/26/18   Khatri, Hina, PA-C  predniSONE (DELTASONE) 20 MG tablet Take 2 tablets (40 mg total) by mouth daily. 10/14/18   Benjiman Core, MD  traMADol (ULTRAM) 50 MG tablet Take 1  tablet (50 mg total) by mouth every 6 (six) hours as needed for severe pain (breakthrough pain). 02/22/18   Alvira Monday, MD    Family History No family history on file.  Social History Social History   Tobacco Use  . Smoking status: Current Every Day Smoker    Packs/day: 1.50    Types: Cigarettes  . Smokeless tobacco: Never Used  Substance Use Topics  . Alcohol use: No    Frequency: Never  . Drug use: Not Currently    Types: Marijuana, Cocaine, Methamphetamines    Comment: denies current use     Allergies   Amoxicillin   Review of Systems Review of Systems  Constitutional: Negative for appetite change.  HENT:       Mouth is dry.  Respiratory: Positive for cough and shortness of breath.   Cardiovascular: Negative for chest pain.  Gastrointestinal: Negative for abdominal pain.  Genitourinary: Negative for flank pain.  Musculoskeletal: Negative for back pain.  Skin: Negative for rash.  Neurological: Positive for dizziness.  Psychiatric/Behavioral: Negative for confusion.     Physical Exam Updated Vital Signs BP 108/60 (BP Location: Left Arm)   Pulse 82   Temp 98 F (36.7 C) (Oral)   Resp 20   Ht 5\' 4"  (1.626 m)   Wt 101 kg  SpO2 94%   BMI 38.20 kg/m   Physical Exam  Constitutional: He appears well-developed.  HENT:  Head: Normocephalic.  Eyes: EOM are normal.  Neck: Neck supple.  Cardiovascular: Normal rate.  Pulmonary/Chest:  Diffuse harsh wheezes and prolonged expirations.  Abdominal: There is no tenderness.  Musculoskeletal: He exhibits no tenderness.  Neurological: He is alert.  Skin: Skin is warm. Capillary refill takes less than 2 seconds.     ED Treatments / Results  Labs (all labs ordered are listed, but only abnormal results are displayed) Labs Reviewed  CBC WITH DIFFERENTIAL/PLATELET  BASIC METABOLIC PANEL    EKG EKG Interpretation  Date/Time:  Wednesday October 13 2018 14:40:16 EDT Ventricular Rate:  61 PR Interval:      QRS Duration: 113 QT Interval:  411 QTC Calculation: 414 R Axis:   123 Text Interpretation:  Sinus rhythm IRBBB and LPFB No significant change since last tracing Confirmed by Benjiman Core [phone]) on 10/13/2018 2:46:41 PM   Radiology Dg Chest 2 View  Result Date: 10/13/2018 CLINICAL DATA:  Chronic cough. Acute onset of fever. Dizziness. EXAM: CHEST - 2 VIEW COMPARISON:  Chest radiograph performed 05/28/2018 FINDINGS: The lungs are well-aerated. Mild peribronchial thickening is noted. There is no evidence of focal opacification, pleural effusion or pneumothorax. The heart is normal in size; the mediastinal contour is within normal limits. No acute osseous abnormalities are seen. IMPRESSION: Mild peribronchial thickening noted. Lungs otherwise clear. Electronically Signed   By: Roanna Raider M.D.   On: 10/13/2018 13:22    Procedures Procedures (including critical care time)  Medications Ordered in ED Medications  albuterol (PROVENTIL HFA;VENTOLIN HFA) 108 (90 Base) MCG/ACT inhaler 2 puff (2 puffs Inhalation Provided for home use 10/13/18 1452)  ipratropium-albuterol (DUONEB) 0.5-2.5 (3) MG/3ML nebulizer solution 3 mL (3 mLs Nebulization Given 10/13/18 1239)  albuterol (PROVENTIL) (2.5 MG/3ML) 0.083% nebulizer solution 2.5 mg (2.5 mg Nebulization Given 10/13/18 1239)  sodium chloride 0.9 % bolus 500 mL ( Intravenous Stopped 10/13/18 1425)  predniSONE (DELTASONE) tablet 40 mg (40 mg Oral Given 10/13/18 1458)     Initial Impression / Assessment and Plan / ED Course  I have reviewed the triage vital signs and the nursing notes.  Pertinent labs & imaging results that were available during my care of the patient were reviewed by me and considered in my medical decision making (see chart for details).     Patient with cough and URI symptoms.  Has been out of his albuterol.  Breathing much improved after breathing treatment and steroids.  Also complaining of dizziness.  Mild  hypotension.  Fluid bolus given and feels somewhat better.  Not orthostatic.  Will treat with steroids given new inhaler.  Follow-up outpatient as needed.  Final Clinical Impressions(s) / ED Diagnoses   Final diagnoses:  Upper respiratory tract infection, unspecified type  Chronic obstructive pulmonary disease, unspecified COPD type [facility])    ED Discharge Orders         Ordered    predniSONE (DELTASONE) 20 MG tablet  Daily     10/13/18 1450           Benjiman Core, MD 10/13/18 1515

## 2018-10-13 NOTE — ED Triage Notes (Signed)
C/o flu like sx and dizziness, fever x 2 days-pt reports chronic cough-NAD-steady gait

## 2018-10-13 NOTE — ED Notes (Signed)
Cab voucher given per request

## 2018-10-13 NOTE — ED Notes (Signed)
Resp Speaking in completed sentences BBS decreased with wheezes t/o.  Per patient out of HFA for several days.

## 2018-10-26 ENCOUNTER — Emergency Department (HOSPITAL_BASED_OUTPATIENT_CLINIC_OR_DEPARTMENT_OTHER): Payer: Self-pay

## 2018-10-26 ENCOUNTER — Emergency Department (HOSPITAL_BASED_OUTPATIENT_CLINIC_OR_DEPARTMENT_OTHER)
Admission: EM | Admit: 2018-10-26 | Discharge: 2018-10-26 | Disposition: A | Discharge status: Home / Self Care | Payer: Self-pay | Attending: Emergency Medicine | Admitting: Emergency Medicine

## 2018-10-26 ENCOUNTER — Encounter (HOSPITAL_BASED_OUTPATIENT_CLINIC_OR_DEPARTMENT_OTHER): Payer: Self-pay | Admitting: *Deleted

## 2018-10-26 ENCOUNTER — Other Ambulatory Visit: Payer: Self-pay

## 2018-10-26 DIAGNOSIS — F17210 Nicotine dependence, cigarettes, uncomplicated: Secondary | ICD-10-CM | POA: Insufficient documentation

## 2018-10-26 DIAGNOSIS — J441 Chronic obstructive pulmonary disease with (acute) exacerbation: Secondary | ICD-10-CM | POA: Insufficient documentation

## 2018-10-26 DIAGNOSIS — Z79899 Other long term (current) drug therapy: Secondary | ICD-10-CM | POA: Insufficient documentation

## 2018-10-26 MED ORDER — ALBUTEROL SULFATE (2.5 MG/3ML) 0.083% IN NEBU
2.5000 mg | INHALATION_SOLUTION | Freq: Once | RESPIRATORY_TRACT | Status: AC
  Administered 2018-10-26: 2.5 mg via RESPIRATORY_TRACT

## 2018-10-26 MED ORDER — PREDNISONE 50 MG PO TABS
60.0000 mg | ORAL_TABLET | Freq: Once | ORAL | Status: AC
  Administered 2018-10-26: 60 mg via ORAL
  Filled 2018-10-26: qty 1

## 2018-10-26 MED ORDER — ALBUTEROL SULFATE (2.5 MG/3ML) 0.083% IN NEBU
5.0000 mg | INHALATION_SOLUTION | Freq: Once | RESPIRATORY_TRACT | Status: AC
  Administered 2018-10-26: 5 mg via RESPIRATORY_TRACT
  Filled 2018-10-26: qty 6

## 2018-10-26 MED ORDER — IPRATROPIUM BROMIDE 0.02 % IN SOLN
0.5000 mg | Freq: Once | RESPIRATORY_TRACT | Status: AC
  Administered 2018-10-26: 0.5 mg via RESPIRATORY_TRACT
  Filled 2018-10-26: qty 2.5

## 2018-10-26 MED ORDER — ALBUTEROL SULFATE HFA 108 (90 BASE) MCG/ACT IN AERS
2.0000 | INHALATION_SPRAY | Freq: Once | RESPIRATORY_TRACT | Status: AC
  Administered 2018-10-26: 2 via RESPIRATORY_TRACT
  Filled 2018-10-26: qty 6.7

## 2018-10-26 MED ORDER — PREDNISONE 20 MG PO TABS: 60.0000 mg | ORAL_TABLET | Freq: Every day | ORAL | 0 refills | Status: AC

## 2018-10-26 MED ORDER — ALBUTEROL SULFATE HFA 108 (90 BASE) MCG/ACT IN AERS: 2.0000 | INHALATION_SPRAY | RESPIRATORY_TRACT | 0 refills | Status: AC | PRN

## 2018-10-26 MED ORDER — IPRATROPIUM-ALBUTEROL 0.5-2.5 (3) MG/3ML IN SOLN
3.0000 mL | Freq: Once | RESPIRATORY_TRACT | Status: AC
Start: 2018-10-26 — End: 2018-10-26
  Administered 2018-10-26: 3 mL via RESPIRATORY_TRACT

## 2018-10-26 NOTE — ED Notes (Signed)
C/o short of breath x 1 day  Has been at work today

## 2018-10-26 NOTE — Discharge Instructions (Signed)
To find a primary care or specialty doctor please call [phone] or [phone] to access "[facility] Find a Doctor Service." ° °You may also go on the [facility] website at [email or web address] ° °There are also multiple [facility], [facility] practices throughout the [place] that are frequently accepting new patients. You may find a clinic that is close to your home and contact them. ° °[facility] -  °[address] °[place] [address] °[phone] ° ° °[facility] -  °[address] °[place] [address] [phone] ° ° °[facility] - °[address]  °[place] [address] °[phone] ° ° °

## 2018-10-26 NOTE — Progress Notes (Signed)
Patient ambulated around department while on pulse ox.  Patient's SPO2 remained between 94% and 97% and HR was between 101 and 105.

## 2018-10-26 NOTE — ED Provider Notes (Signed)
TIME SEEN: 1:05 AM  CHIEF COMPLAINT: Shortness of breath, wheezing  HPI: Patient is a 52 year old male with history of polysubstance abuse, COPD who presents to the emergency department with shortness of breath and wheezing that started last night.  Ran out of his albuterol inhaler at home.  Does not have a nebulizer machine.  States his PCP is in [place] but he lives in [place] and works in [employer].  He has had a cough with brown sputum production.  No fever.  No chest pain.  Did have some nausea that has resolved.  ROS: See HPI Constitutional: no fever  Eyes: no drainage  ENT: no runny nose   Cardiovascular:  no chest pain  Resp: no SOB  GI: no vomiting GU: no dysuria Integumentary: no rash  Allergy: no hives  Musculoskeletal: no leg swelling  Neurological: no slurred speech ROS otherwise negative  PAST MEDICAL HISTORY/PAST SURGICAL HISTORY:  Past Medical History:  Diagnosis Date  . Asthma   . Chronic back pain   . COPD (chronic obstructive pulmonary disease) (HCC)   . Polysubstance abuse (HCC)   . Vertigo     MEDICATIONS:  Prior to Admission medications   Medication Sig Start Date End Date Taking? Authorizing Provider  albuterol (PROVENTIL HFA;VENTOLIN HFA) 108 (90 Base) MCG/ACT inhaler Inhale 1-2 puffs into the lungs every 6 (six) hours as needed for wheezing or shortness of breath. 06/26/17   Vanetta Mulders, MD  ipratropium (ATROVENT HFA) 17 MCG/ACT inhaler Inhale 2 puffs into the lungs every 6 (six) hours as needed for wheezing. 05/28/18   Palumbo, April, MD  levalbuterol [facility] HFA) 45 MCG/ACT inhaler Inhale 1-2 puffs into the lungs every 6 (six) hours as needed for wheezing. 05/28/18   Palumbo, April, MD  meclizine (ANTIVERT) 12.5 MG tablet Take 1 tablet (12.5 mg total) by mouth 3 (three) times daily as needed for dizziness. 02/26/18   Khatri, Hina, PA-C  predniSONE (DELTASONE) 20 MG tablet Take 2 tablets (40 mg total) by mouth daily. 10/14/18   Benjiman Core,  MD  traMADol (ULTRAM) 50 MG tablet Take 1 tablet (50 mg total) by mouth every 6 (six) hours as needed for severe pain (breakthrough pain). 02/22/18   Alvira Monday, MD    ALLERGIES:  Allergies  Allergen Reactions  . Amoxicillin Rash    Has patient had a PCN reaction causing immediate rash, facial/tongue/throat swelling, SOB or lightheadedness with hypotension: Unknown Has patient had a PCN reaction causing severe rash involving mucus membranes or skin necrosis: Unknown Has patient had a PCN reaction that required hospitalization: Unknown Has patient had a PCN reaction occurring within the last 10 years: No If all of the above answers are "NO", then may proceed with Cephalosporin use.     SOCIAL HISTORY:  Social History   Tobacco Use  . Smoking status: Current Every Day Smoker    Packs/day: 1.50    Types: Cigarettes  . Smokeless tobacco: Never Used  Substance Use Topics  . Alcohol use: No    Frequency: Never    FAMILY HISTORY: History reviewed. No pertinent family history.  EXAM: BP (!) 106/57 (BP Location: Right Arm)   Pulse 77   Temp 97.6 F (36.4 C)   Resp 20   Ht 5\' 4"  (1.626 m)   Wt 100.9 kg   SpO2 98%   BMI 38.18 kg/m  CONSTITUTIONAL: Alert and oriented and responds appropriately to questions. Well-appearing; well-nourished HEAD: Normocephalic EYES: Conjunctivae clear, pupils appear equal, EOMI ENT: normal nose;  moist mucous membranes NECK: Supple, no meningismus, no nuchal rigidity, no LAD  CARD: RRR; S1 and S2 appreciated; no murmurs, no clicks, no rubs, no gallops RESP: Normal chest excursion without splinting or tachypnea; breath sounds equal bilaterally but diffuse inspiratory and expiratory wheezes, no rhonchi or rales, no hypoxia on room air, able to speak full sentences, no respiratory distress ABD/GI: Normal bowel sounds; non-distended; soft, non-tender, no rebound, no guarding, no peritoneal signs, no hepatosplenomegaly BACK:  The back appears  normal and is non-tender to palpation, there is no CVA tenderness EXT: Normal ROM in all joints; non-tender to palpation; no edema; normal capillary refill; no cyanosis, no calf tenderness or swelling    SKIN: Normal color for age and race; warm; no rash NEURO: Moves all extremities equally PSYCH: The patient's mood and manner are appropriate. Grooming and personal hygiene are appropriate.  MEDICAL DECISION MAKING: Patient here with COPD exacerbation.  Will give albuterol, Atrovent, prednisone.  Will obtain chest x-ray.  Does not appear volume overloaded.  Doubt ACS, PE.  He has no chest pain today.  ED PROGRESS: Patient's lungs are now clear to auscultation after 2 breathing treatments.  His chest x-ray is clear.  He desaturates into the mid 80s when sleeping.  Suspect a component of obstructive sleep apnea.  Does not wear oxygen chronically.  When he is awake his oxygen level improved to the upper 90s.  Will ambulate with pulse oximetry prior to discharge home.  Will provide with albuterol inhaler.    Patient's oxygen saturation drops between 87 to 89% while walking.  He was able to walk briskly.  Will give third breathing treatment and reassess.   Sats now 94-96% with ambulation.  Will discharge home with albuterol inhaler, prednisone burst.   At this time, I do not feel there is any life-threatening condition present. I have reviewed and discussed all results (EKG, imaging, lab, urine as appropriate) and exam findings with patient/family. I have reviewed nursing notes and appropriate previous records.  I feel the patient is safe to be discharged home without further emergent workup and can continue workup as an outpatient as needed. Discussed usual and customary return precautions. Patient/family verbalize understanding and are comfortable with this plan.  Outpatient follow-up has been provided if needed. All questions have been answered.   Alura Olveda, Layla MawKristen N, DO 10/26/18 0330

## 2018-10-26 NOTE — ED Triage Notes (Signed)
Pt c/o SOB x 1 day with HX COPD

## 2019-03-04 ENCOUNTER — Inpatient Hospital Stay (HOSPITAL_COMMUNITY)
Admission: EM | Admit: 2019-03-04 | Discharge: 2019-03-05 | DRG: 190 | Discharge status: Against Medical Advice | Payer: Self-pay | Attending: Internal Medicine | Admitting: Internal Medicine

## 2019-03-04 ENCOUNTER — Other Ambulatory Visit: Payer: Self-pay

## 2019-03-04 ENCOUNTER — Emergency Department (HOSPITAL_COMMUNITY): Payer: Self-pay

## 2019-03-04 DIAGNOSIS — D649 Anemia, unspecified: Secondary | ICD-10-CM

## 2019-03-04 DIAGNOSIS — J441 Chronic obstructive pulmonary disease with (acute) exacerbation: Principal | ICD-10-CM | POA: Diagnosis present

## 2019-03-04 DIAGNOSIS — Z880 Allergy status to penicillin: Secondary | ICD-10-CM

## 2019-03-04 DIAGNOSIS — M549 Dorsalgia, unspecified: Secondary | ICD-10-CM | POA: Diagnosis present

## 2019-03-04 DIAGNOSIS — T401X4A Poisoning by heroin, undetermined, initial encounter: Secondary | ICD-10-CM

## 2019-03-04 DIAGNOSIS — J9601 Acute respiratory failure with hypoxia: Secondary | ICD-10-CM

## 2019-03-04 DIAGNOSIS — G8929 Other chronic pain: Secondary | ICD-10-CM | POA: Diagnosis present

## 2019-03-04 DIAGNOSIS — J45901 Unspecified asthma with (acute) exacerbation: Secondary | ICD-10-CM | POA: Diagnosis present

## 2019-03-04 DIAGNOSIS — T40601A Poisoning by unspecified narcotics, accidental (unintentional), initial encounter: Secondary | ICD-10-CM | POA: Diagnosis present

## 2019-03-04 DIAGNOSIS — E876 Hypokalemia: Secondary | ICD-10-CM

## 2019-03-04 DIAGNOSIS — F17210 Nicotine dependence, cigarettes, uncomplicated: Secondary | ICD-10-CM | POA: Diagnosis present

## 2019-03-04 DIAGNOSIS — Y92481 Parking lot as the place of occurrence of the external cause: Secondary | ICD-10-CM

## 2019-03-04 LAB — CBC
HCT: 38.8 % — ABNORMAL LOW (ref 39.0–52.0)
Hemoglobin: 12.1 g/dL — ABNORMAL LOW (ref 13.0–17.0)
MCH: 28.9 pg (ref 26.0–34.0)
MCHC: 31.2 g/dL (ref 30.0–36.0)
MCV: 92.8 fL (ref 80.0–100.0)
Platelets: 142 10*3/uL — ABNORMAL LOW (ref 150–400)
RBC: 4.18 MIL/uL — ABNORMAL LOW (ref 4.22–5.81)
RDW: 12.5 % (ref 11.5–15.5)
WBC: 6.9 10*3/uL (ref 4.0–10.5)
nRBC: 0 % (ref 0.0–0.2)

## 2019-03-04 LAB — COMPREHENSIVE METABOLIC PANEL
ALT: 16 U/L (ref 0–44)
AST: 15 U/L (ref 15–41)
Albumin: 3.3 g/dL — ABNORMAL LOW (ref 3.5–5.0)
Alkaline Phosphatase: 55 U/L (ref 38–126)
Anion gap: 7 (ref 5–15)
BUN: 15 mg/dL (ref 6–20)
CO2: 21 mmol/L — ABNORMAL LOW (ref 22–32)
Calcium: 7.3 mg/dL — ABNORMAL LOW (ref 8.9–10.3)
Chloride: 114 mmol/L — ABNORMAL HIGH (ref 98–111)
Creatinine, Ser: 0.74 mg/dL (ref 0.61–1.24)
GFR calc Af Amer: >60 mL/min (ref >60)
GFR calc non Af Amer: >60 mL/min (ref >60)
Glucose, Bld: 120 mg/dL — ABNORMAL HIGH (ref 70–99)
Potassium: 3.3 mmol/L — ABNORMAL LOW (ref 3.5–5.1)
Sodium: 142 mmol/L (ref 135–145)
Total Bilirubin: 0.6 mg/dL (ref 0.3–1.2)
Total Protein: 5.8 g/dL — ABNORMAL LOW (ref 6.5–8.1)

## 2019-03-04 LAB — ETHANOL: Alcohol, Ethyl (B): <10 mg/dL (ref <10)

## 2019-03-04 MED ORDER — IPRATROPIUM-ALBUTEROL 0.5-2.5 (3) MG/3ML IN SOLN: 3.0000 mL | Freq: Four times a day (QID) | RESPIRATORY_TRACT | Status: DC

## 2019-03-04 MED ORDER — SODIUM CHLORIDE 0.9 % IV BOLUS
1000.0000 mL | Freq: Once | INTRAVENOUS | Status: AC
  Administered 2019-03-04: 1000 mL via INTRAVENOUS

## 2019-03-04 MED ORDER — PREDNISONE 20 MG PO TABS
40.0000 mg | ORAL_TABLET | Freq: Every day | ORAL | Status: DC
  Administered 2019-03-05: 40 mg via ORAL
  Filled 2019-03-04: qty 2

## 2019-03-04 MED ORDER — AZITHROMYCIN 250 MG PO TABS: 250.0000 mg | ORAL_TABLET | Freq: Every day | ORAL | Status: DC

## 2019-03-04 MED ORDER — BUDESONIDE 0.25 MG/2ML IN SUSP
0.2500 mg | Freq: Two times a day (BID) | RESPIRATORY_TRACT | Status: DC
  Administered 2019-03-05: 0.25 mg via RESPIRATORY_TRACT
  Filled 2019-03-04: qty 2

## 2019-03-04 MED ORDER — IPRATROPIUM-ALBUTEROL 0.5-2.5 (3) MG/3ML IN SOLN
3.0000 mL | Freq: Once | RESPIRATORY_TRACT | Status: AC
  Administered 2019-03-04: 3 mL via RESPIRATORY_TRACT
  Filled 2019-03-04: qty 3

## 2019-03-04 MED ORDER — METHYLPREDNISOLONE SODIUM SUCC 125 MG IJ SOLR
60.0000 mg | Freq: Once | INTRAMUSCULAR | Status: AC
  Administered 2019-03-04: 60 mg via INTRAVENOUS
  Filled 2019-03-04: qty 2

## 2019-03-04 MED ORDER — AZITHROMYCIN 250 MG PO TABS
500.0000 mg | ORAL_TABLET | Freq: Once | ORAL | Status: AC
  Administered 2019-03-05: 500 mg via ORAL
  Filled 2019-03-04: qty 2

## 2019-03-04 MED ORDER — ALBUTEROL SULFATE (2.5 MG/3ML) 0.083% IN NEBU: 2.5000 mg | INHALATION_SOLUTION | RESPIRATORY_TRACT | Status: DC | PRN

## 2019-03-04 MED ORDER — POTASSIUM CHLORIDE CRYS ER 20 MEQ PO TBCR: 30.0000 meq | EXTENDED_RELEASE_TABLET | Freq: Once | ORAL | Status: DC

## 2019-03-04 MED ORDER — ENOXAPARIN SODIUM 40 MG/0.4ML ~~LOC~~ SOLN: 40.0000 mg | SUBCUTANEOUS | Status: DC

## 2019-03-04 MED ORDER — NALOXONE HCL 4 MG/10ML IJ SOLN
1.0000 mg/h | INTRAVENOUS | Status: DC
  Administered 2019-03-04: 1 mg/h via INTRAVENOUS
  Filled 2019-03-04 (×2): qty 10

## 2019-03-04 MED ORDER — IPRATROPIUM BROMIDE 0.02 % IN SOLN
0.5000 mg | Freq: Once | RESPIRATORY_TRACT | Status: AC
  Administered 2019-03-04: 0.5 mg via RESPIRATORY_TRACT
  Filled 2019-03-04: qty 2.5

## 2019-03-04 NOTE — ED Notes (Signed)
Pt able to verbalize correct time, location, name, DOB.  Pt acknowledges chronic use of illicit drugs (meth and heroine), denies drinking. Pt with slurred speech, able to follow commands, calm and cooperative.
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
## 2019-03-04 NOTE — ED Notes (Signed)
SpO2 dropped to 80% on RA prior to ambulation; placed patient on 2L of O2 via [place]. SpO2 is now 92%. MD made aware.

## 2019-03-04 NOTE — ED Notes (Signed)
Patient transported to CT 

## 2019-03-04 NOTE — ED Notes (Signed)
Pt placed on 3L O2 by nasal cannula d/t decreased O2 saturations on room air (87%).  Pt O2 sats elevated to 93% on 3L.  PA made aware.

## 2019-03-04 NOTE — ED Notes (Signed)
Bed: WA20 Expected date:  Expected time:  Means of arrival:  Comments: EMS OD , narcan'd, now responsive

## 2019-03-04 NOTE — ED Notes (Signed)
Went into pt's room to ambulate him and as I was talking to him he went right back to sleep.  I will try again a little bit later.
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
## 2019-03-05 ENCOUNTER — Other Ambulatory Visit: Payer: Self-pay

## 2019-03-05 DIAGNOSIS — D649 Anemia, unspecified: Secondary | ICD-10-CM

## 2019-03-05 DIAGNOSIS — J9601 Acute respiratory failure with hypoxia: Secondary | ICD-10-CM

## 2019-03-05 DIAGNOSIS — E876 Hypokalemia: Secondary | ICD-10-CM

## 2019-03-05 DIAGNOSIS — T40601A Poisoning by unspecified narcotics, accidental (unintentional), initial encounter: Secondary | ICD-10-CM | POA: Diagnosis present

## 2019-03-05 LAB — RAPID URINE DRUG SCREEN, HOSP PERFORMED
Amphetamines: POSITIVE — AB
Barbiturates: NOT DETECTED
Benzodiazepines: NOT DETECTED
Cocaine: POSITIVE — AB
Opiates: POSITIVE — AB
Tetrahydrocannabinol: NOT DETECTED

## 2019-03-05 LAB — CBC
HCT: 42.9 % (ref 39.0–52.0)
Hemoglobin: 13.8 g/dL (ref 13.0–17.0)
MCH: 29.3 pg (ref 26.0–34.0)
MCHC: 32.2 g/dL (ref 30.0–36.0)
MCV: 91.1 fL (ref 80.0–100.0)
Platelets: 271 10*3/uL (ref 150–400)
RBC: 4.71 MIL/uL (ref 4.22–5.81)
RDW: 12.4 % (ref 11.5–15.5)
WBC: 11.2 10*3/uL — ABNORMAL HIGH (ref 4.0–10.5)
nRBC: 0 % (ref 0.0–0.2)

## 2019-03-05 LAB — IRON AND TIBC
Iron: 63 ug/dL (ref 45–182)
Saturation Ratios: 22 % (ref 17.9–39.5)
TIBC: 286 ug/dL (ref 250–450)
UIBC: 223 ug/dL

## 2019-03-05 LAB — HIV ANTIBODY (ROUTINE TESTING W REFLEX): HIV Screen 4th Generation wRfx: NONREACTIVE

## 2019-03-05 LAB — FERRITIN: Ferritin: 96 ng/mL (ref 24–336)

## 2019-03-05 LAB — MAGNESIUM: Magnesium: 2.1 mg/dL (ref 1.7–2.4)

## 2019-03-05 MED ORDER — NALOXONE HCL 0.4 MG/ML IJ SOLN: 0.4000 mg | INTRAMUSCULAR | Status: DC | PRN

## 2019-03-05 MED ORDER — ENSURE ENLIVE PO LIQD: 237.0000 mL | Freq: Two times a day (BID) | ORAL | Status: DC

## 2019-03-05 MED ORDER — IPRATROPIUM-ALBUTEROL 0.5-2.5 (3) MG/3ML IN SOLN
3.0000 mL | Freq: Four times a day (QID) | RESPIRATORY_TRACT | Status: DC
  Administered 2019-03-05: 3 mL via RESPIRATORY_TRACT
  Filled 2019-03-05: qty 3

## 2019-03-05 NOTE — Progress Notes (Signed)
Writer attempted to call several numbers listed in patient's demographics from several different facilities, with no answer to either number. Will remove pt from current patients list for [facility].
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
## 2019-03-05 NOTE — ED Notes (Signed)
Called report; was instructed by RN that room is not ready and that I will be notified once it is cleaned.
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
## 2019-03-05 NOTE — Progress Notes (Signed)
Writer went to pt's room after bieng informed from staff nurse that pt was dressed and stating he was leaving. Upon arrival to Room 1511, pt was already gone. Telemetry box recovered. Security, [facility], and MD made aware.
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
## 2019-03-06 LAB — CALCIUM, IONIZED: Calcium, Ionized, Serum: 4.8 mg/dL (ref 4.5–5.6)

## 2019-03-06 LAB — PARATHYROID HORMONE, INTACT (NO CA): PTH: 21 pg/mL (ref 15–65)

## 2019-03-07 LAB — VITAMIN D 25 HYDROXY (VIT D DEFICIENCY, FRACTURES): Vit D, 25-Hydroxy: 13.1 ng/mL — ABNORMAL LOW (ref 30.0–100.0)

## 2019-03-12 IMAGING — CR DG CHEST 2V
2 series · 2 of 2 positions shown · non-contrast
Comparison: 02/26/2018

CLINICAL DATA: Shortness of breath

EXAM:
CHEST - 2 VIEW

[w chest pa]
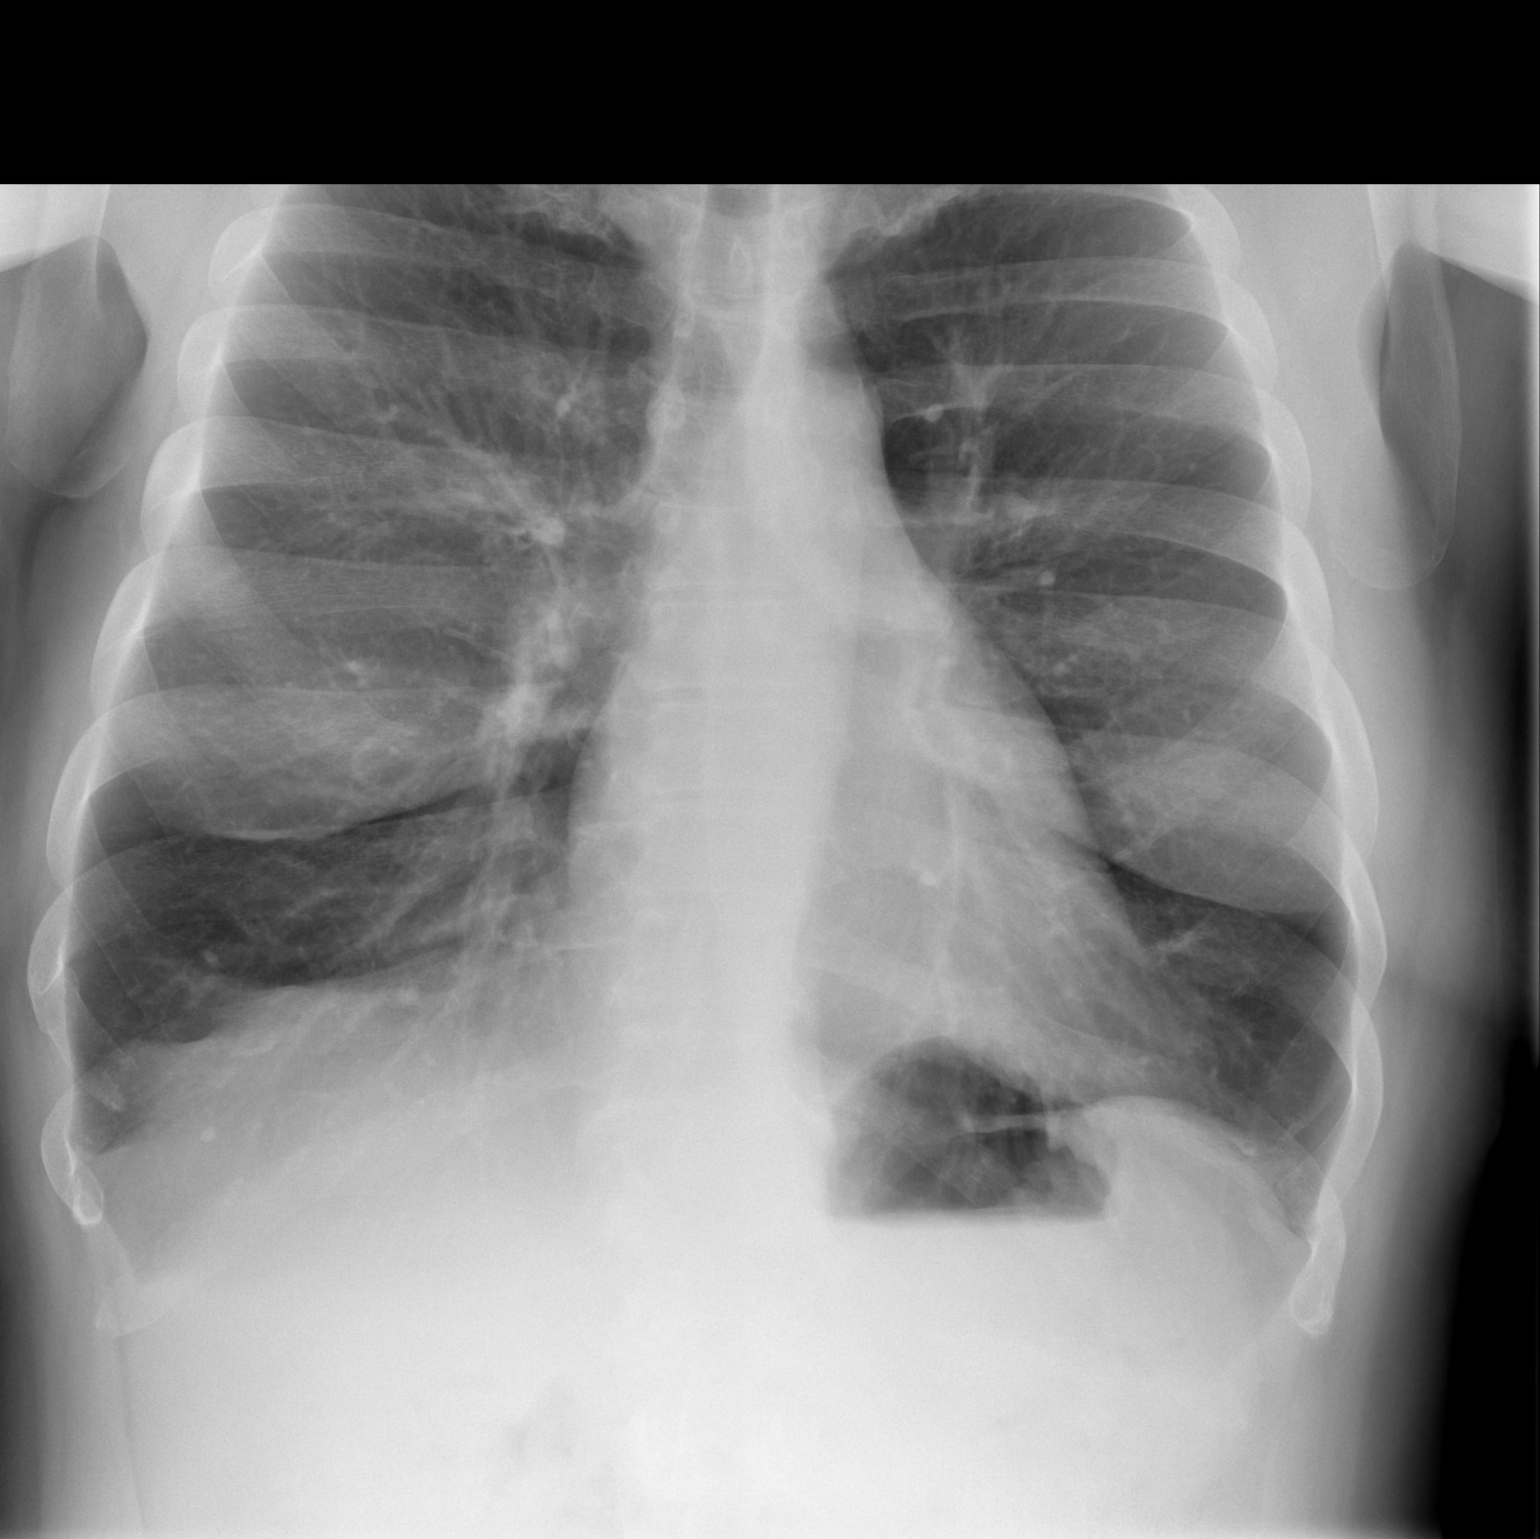

[w chest lat]
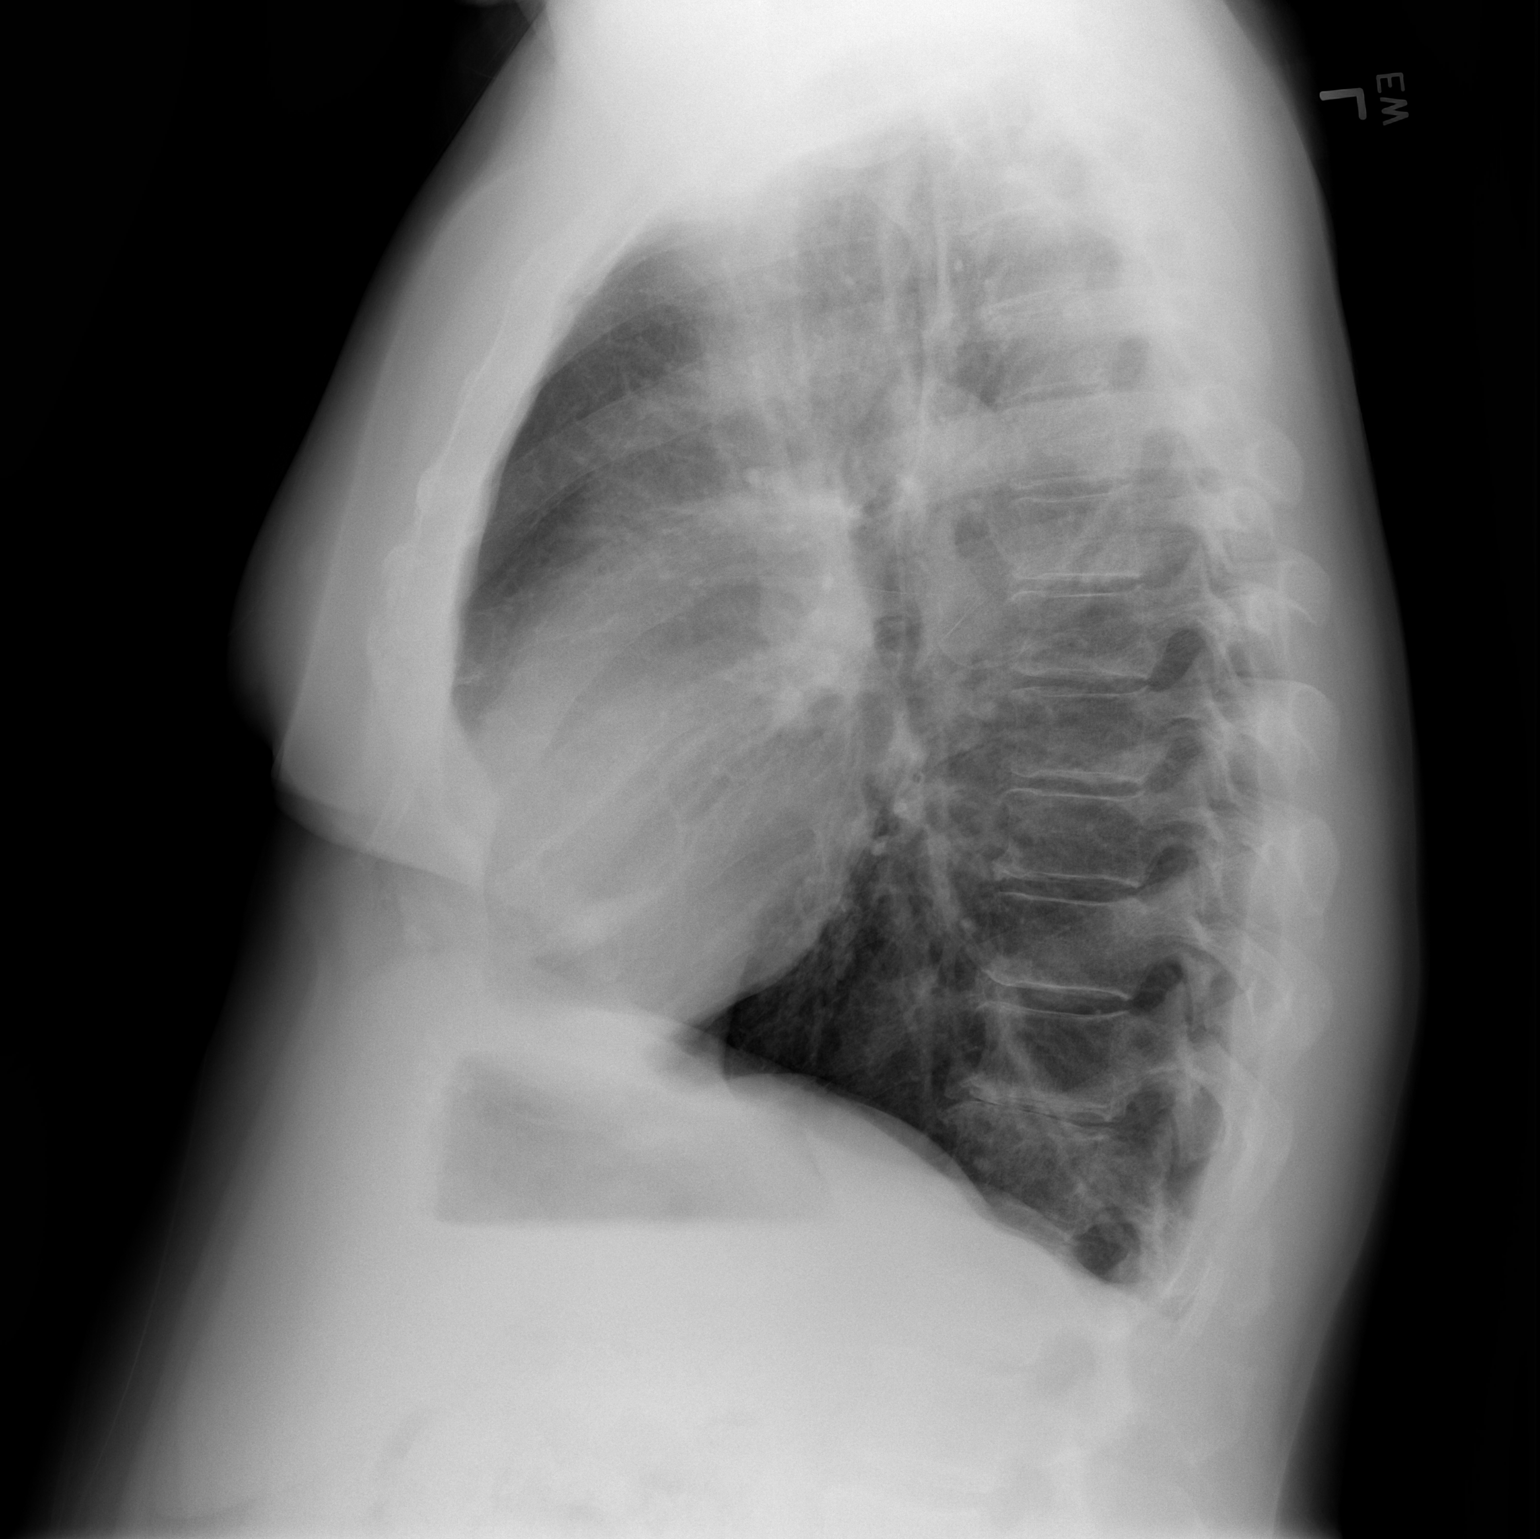

[2 of 2 positions shown; findings below may reference images not displayed]

FINDINGS: The lungs are hyperinflated with diffuse interstitial prominence. No
focal airspace consolidation or pulmonary edema. No pleural effusion
or pneumothorax. Normal cardiomediastinal contours.
IMPRESSION: COPD without acute airspace disease.

## 2024-07-05 ENCOUNTER — Other Ambulatory Visit (HOSPITAL_COMMUNITY): Payer: Self-pay

## 2024-07-05 ENCOUNTER — Encounter (HOSPITAL_COMMUNITY): Payer: Self-pay

## 2024-07-05 ENCOUNTER — Emergency Department (HOSPITAL_COMMUNITY): Payer: Self-pay

## 2024-07-05 ENCOUNTER — Emergency Department (HOSPITAL_COMMUNITY)
Admission: EM | Admit: 2024-07-05 | Discharge: 2024-07-05 | Disposition: A | Discharge status: Home / Self Care | Payer: Self-pay

## 2024-07-05 ENCOUNTER — Other Ambulatory Visit: Payer: Self-pay

## 2024-07-05 DIAGNOSIS — Z7951 Long term (current) use of inhaled steroids: Secondary | ICD-10-CM | POA: Insufficient documentation

## 2024-07-05 DIAGNOSIS — Z7952 Long term (current) use of systemic steroids: Secondary | ICD-10-CM | POA: Diagnosis not present

## 2024-07-05 DIAGNOSIS — D72829 Elevated white blood cell count, unspecified: Secondary | ICD-10-CM | POA: Diagnosis not present

## 2024-07-05 DIAGNOSIS — L03032 Cellulitis of left toe: Secondary | ICD-10-CM | POA: Insufficient documentation

## 2024-07-05 DIAGNOSIS — J441 Chronic obstructive pulmonary disease with (acute) exacerbation: Secondary | ICD-10-CM | POA: Insufficient documentation

## 2024-07-05 DIAGNOSIS — J45909 Unspecified asthma, uncomplicated: Secondary | ICD-10-CM | POA: Insufficient documentation

## 2024-07-05 DIAGNOSIS — R059 Cough, unspecified: Secondary | ICD-10-CM | POA: Diagnosis not present

## 2024-07-05 LAB — RESP PANEL BY RT-PCR (RSV, FLU A&B, COVID)  RVPGX2
Influenza A by PCR: NEGATIVE
Influenza B by PCR: NEGATIVE
Resp Syncytial Virus by PCR: NEGATIVE
SARS Coronavirus 2 by RT PCR: NEGATIVE

## 2024-07-05 LAB — CBC WITH DIFFERENTIAL/PLATELET
Abs Immature Granulocytes: 0.05 K/uL (ref 0.00–0.07)
Basophils Absolute: 0 K/uL (ref 0.0–0.1)
Basophils Relative: 0 %
Eosinophils Absolute: 0.5 K/uL (ref 0.0–0.5)
Eosinophils Relative: 4 %
HCT: 42.3 % (ref 39.0–52.0)
Hemoglobin: 13.6 g/dL (ref 13.0–17.0)
Immature Granulocytes: 0 %
Lymphocytes Relative: 15 %
Lymphs Abs: 1.9 K/uL (ref 0.7–4.0)
MCH: 28.5 pg (ref 26.0–34.0)
MCHC: 32.2 g/dL (ref 30.0–36.0)
MCV: 88.7 fL (ref 80.0–100.0)
Monocytes Absolute: 1 K/uL (ref 0.1–1.0)
Monocytes Relative: 8 %
Neutro Abs: 8.7 K/uL — ABNORMAL HIGH (ref 1.7–7.7)
Neutrophils Relative %: 73 %
Platelets: 290 K/uL (ref 150–400)
RBC: 4.77 MIL/uL (ref 4.22–5.81)
RDW: 13.6 % (ref 11.5–15.5)
WBC: 12.2 K/uL — ABNORMAL HIGH (ref 4.0–10.5)
nRBC: 0 % (ref 0.0–0.2)

## 2024-07-05 LAB — COMPREHENSIVE METABOLIC PANEL WITH GFR
ALT: 17 U/L (ref 0–44)
AST: 19 U/L (ref 15–41)
Albumin: 3.3 g/dL — ABNORMAL LOW (ref 3.5–5.0)
Alkaline Phosphatase: 68 U/L (ref 38–126)
Anion gap: 10 (ref 5–15)
BUN: 17 mg/dL (ref 6–20)
CO2: 26 mmol/L (ref 22–32)
Calcium: 8.5 mg/dL — ABNORMAL LOW (ref 8.9–10.3)
Chloride: 102 mmol/L (ref 98–111)
Creatinine, Ser: 1.37 mg/dL — ABNORMAL HIGH (ref 0.61–1.24)
GFR, Estimated: 59 mL/min — ABNORMAL LOW (ref >60)
Glucose, Bld: 96 mg/dL (ref 70–99)
Potassium: 3.5 mmol/L (ref 3.5–5.1)
Sodium: 138 mmol/L (ref 135–145)
Total Bilirubin: 0.4 mg/dL (ref 0.0–1.2)
Total Protein: 6.8 g/dL (ref 6.5–8.1)

## 2024-07-05 LAB — MAGNESIUM: Magnesium: 2 mg/dL (ref 1.7–2.4)

## 2024-07-05 MED ORDER — METHYLPREDNISOLONE SODIUM SUCC 125 MG IJ SOLR
125.0000 mg | Freq: Once | INTRAMUSCULAR | Status: AC
  Administered 2024-07-05: 125 mg via INTRAVENOUS
  Filled 2024-07-05: qty 2

## 2024-07-05 MED ORDER — SODIUM CHLORIDE 0.9 % IV SOLN
1.0000 g | Freq: Once | INTRAVENOUS | Status: AC
  Administered 2024-07-05: 1 g via INTRAVENOUS
  Filled 2024-07-05: qty 10

## 2024-07-05 MED ORDER — ALBUTEROL SULFATE HFA 108 (90 BASE) MCG/ACT IN AERS
2.0000 | INHALATION_SPRAY | Freq: Once | RESPIRATORY_TRACT | Status: AC
  Administered 2024-07-05: 2 via RESPIRATORY_TRACT
  Filled 2024-07-05: qty 6.7

## 2024-07-05 MED ORDER — IPRATROPIUM-ALBUTEROL 0.5-2.5 (3) MG/3ML IN SOLN
3.0000 mL | Freq: Once | RESPIRATORY_TRACT | Status: AC
  Administered 2024-07-05: 3 mL via RESPIRATORY_TRACT
  Filled 2024-07-05: qty 3

## 2024-07-05 MED ORDER — PREDNISONE 20 MG PO TABS
40.0000 mg | ORAL_TABLET | Freq: Every day | ORAL | 0 refills | Status: AC
  Filled 2024-07-05: qty 10, 5d supply, fill #0

## 2024-07-05 MED ORDER — DOXYCYCLINE HYCLATE 100 MG PO TABS
100.0000 mg | ORAL_TABLET | Freq: Two times a day (BID) | ORAL | 0 refills | Status: AC
  Filled 2024-07-05: qty 20, 10d supply, fill #0

## 2024-07-05 NOTE — Discharge Planning (Signed)
 RNCM consulted regarding pt needing medication assistance.  RNCM suggested sending Rx to [facility] pharmacy to fill and deliver to pt at bedside prior to discharge.  Maliek Schellhorn J. Debarah, BSN, RN, [facility]  IP Care Management  Nurse Case Manager  [facility] Emergency Departments  Operative Services  [phone]

## 2024-07-05 NOTE — ED Provider Notes (Signed)
 [place] EMERGENCY DEPARTMENT AT [facility] Provider Note   CSN: [id number] Arrival date & time: 07/05/24  9147     Patient presents with: Cough   Robert Swanson is a 58 y.o. male.   58 year old male with past medical history significant for COPD presents today for cough, shortness of breath.  Patient is homeless.  Cough is productive.  He states he was in rain 3 days ago and believes that is what is causing his flareup.  He does use an inhaler but otherwise is not on any medications.  Also complains of pain to the 3rd and 4th digit of the left foot.  Denies any injury.  He is not a diabetic.  Does do a lot of walking.  The history is provided by the patient. No language interpreter was used.       Prior to Admission medications   Medication Sig Start Date End Date Taking? Authorizing Provider  albuterol  (PROVENTIL  HFA;VENTOLIN  HFA) 108 (90 Base) MCG/ACT inhaler Inhale 2 puffs into the lungs every 4 (four) hours as needed for wheezing or shortness of breath. 10/26/18   Ward, Josette SAILOR, DO  naproxen sodium (ALEVE) 220 MG tablet Take 440 mg by mouth 2 (two) times daily as needed (pain).    [provider]  predniSONE  (DELTASONE ) 20 MG tablet Take 3 tablets (60 mg total) by mouth daily. Patient not taking: Reported on 03/04/2019 10/26/18   Ward, Josette SAILOR, DO    Allergies: Amoxicillin    Review of Systems  Constitutional:  Negative for chills and fever.  Respiratory:  Positive for cough, shortness of breath and wheezing.   Cardiovascular:  Negative for chest pain.  All other systems reviewed and are negative.   Updated Vital Signs BP 116/67 (BP Location: Left Arm)   Pulse 93   Temp 98.5 F (36.9 C) (Oral)   Resp 16   Ht 5' 9 (1.753 m)   Wt 108.9 kg   SpO2 98%   BMI 35.44 kg/m   Physical Exam Vitals and nursing note reviewed.  Constitutional:      General: He is not in acute distress.    Appearance: Normal appearance. He is not ill-appearing.   HENT:     Head: Normocephalic and atraumatic.     Nose: Nose normal.  Eyes:     Conjunctiva/sclera: Conjunctivae normal.  Cardiovascular:     Rate and Rhythm: Normal rate and regular rhythm.  Pulmonary:     Effort: Pulmonary effort is normal. No respiratory distress.     Breath sounds: Wheezing present.  Musculoskeletal:        General: No deformity. Normal range of motion.     Cervical back: Normal range of motion.     Right lower leg: No edema.     Left lower leg: No edema.     Comments: Mild erythema noted to 3rd and 4th digits with small lesion.  Neurovascularly intact.  No drainage.  Skin:    Findings: No rash.  Neurological:     Mental Status: He is alert.     (all labs ordered are listed, but only abnormal results are displayed) Labs Reviewed  RESP PANEL BY RT-PCR (RSV, FLU A&B, COVID)  RVPGX2  CBC WITH DIFFERENTIAL/PLATELET  COMPREHENSIVE METABOLIC PANEL WITH GFR  MAGNESIUM    EKG: None  Radiology: No results found.   Procedures   Medications Ordered in the ED  ipratropium-albuterol  (DUONEB) 0.5-2.5 (3) MG/3ML nebulizer solution 3 mL (has no administration in time  range)  methylPREDNISolone  sodium succinate (SOLU-MEDROL ) 125 mg/2 mL injection 125 mg (has no administration in time range)                                    Medical Decision Making Amount and/or Complexity of Data Reviewed Labs: ordered. Radiology: ordered.  Risk Prescription drug management.   Medical Decision Making / ED Course   This patient presents to the ED for concern of shortness of breath, this involves an extensive number of treatment options, and is a complaint that carries with it a high risk of complications and morbidity.  The differential diagnosis includes COPD exacerbation, asthma, CHF, viral URI, PE Low suspicion for PE given history and physical exam.  MDM: 58 year old male presents today for concern of shortness of breath.  He does have history of COPD.  Lung  sounds on exam are significant for diffuse wheeze.  He does have a cough.  Symptoms started 3 days ago.  Labs obtained.  Checks x-ray obtained.  Steroids given.  Mild renal insufficiency on CMP but no AKI.  CBC with mild leukocytosis but no significant left shift.  Chest x-ray without acute cardiopulmonary process.  Respiratory panel negative. Patient also complains of left foot.  He does walk a lot given he is homeless.  No visual defect outside of mild erythema to 3rd and 4th digit.  He is not a diabetic.  X-ray obtained.  No bony abnormality. Patient given Rocephin  in the emergency department.  He was ambulated.  It appears he desatted to 87% transiently.  I asked patient how he felt when ambulating he states I felt good.  Advised him of the hypoxic episode but he states his hand was raised and feels that it was not picking up accurately.  I did offer him admission, as well as to repeat the ambulating trial however he declines.  I discussed the risks of going home if he is truly hypoxic.  He understands but still would like to go home.  Antibiotic and steroids prescribed.  Referral given to set up a primary care doctor.  Strict return precautions given.  Lab Tests: -I ordered, reviewed, and interpreted labs.   The pertinent results include:   Labs Reviewed  CBC WITH DIFFERENTIAL/PLATELET - Abnormal; Notable for the following components:      Result Value   WBC 12.2 (*)    Neutro Abs 8.7 (*)    All other components within normal limits  COMPREHENSIVE METABOLIC PANEL WITH GFR - Abnormal; Notable for the following components:   Creatinine, Ser 1.37 (*)    Calcium 8.5 (*)    Albumin 3.3 (*)    GFR, Estimated 59 (*)    All other components within normal limits  RESP PANEL BY RT-PCR (RSV, FLU A&B, COVID)  RVPGX2  MAGNESIUM      EKG  EKG Interpretation Date/Time:    Ventricular Rate:    PR Interval:    QRS Duration:    QT Interval:    QTC Calculation:   R Axis:      Text  Interpretation:           Imaging Studies ordered: I ordered imaging studies including chest x-ray, foot x-ray I independently visualized and interpreted imaging. I agree with the radiologist interpretation   Medicines ordered and prescription drug management: Meds ordered this encounter  Medications   ipratropium-albuterol  (DUONEB) 0.5-2.5 (3) MG/3ML nebulizer solution 3  mL   methylPREDNISolone  sodium succinate (SOLU-MEDROL ) 125 mg/2 mL injection 125 mg    IV methylprednisolone  will be converted to either a q12h or q24h frequency with the same total daily dose (TDD).  Ordered Dose: 1 to 125 mg TDD; convert to: TDD q24h.  Ordered Dose: 126 to 250 mg TDD; convert to: TDD div q12h.  Ordered Dose: >250 mg TDD; DAW.   cefTRIAXone  (ROCEPHIN ) 1 g in sodium chloride  0.9 % 100 mL IVPB    Antibiotic Indication::   CAP   albuterol  (VENTOLIN  HFA) 108 (90 Base) MCG/ACT inhaler 2 puff   doxycycline  (VIBRA -TABS) 100 MG tablet    Sig: Take 1 tablet (100 mg total) by mouth 2 (two) times daily.    Dispense:  20 tablet    Refill:  0    Supervising Provider:   MILLER, BRIAN [[id number]]   predniSONE  (DELTASONE ) 20 MG tablet    Sig: Take 2 tablets (40 mg total) by mouth daily with breakfast for 5 days.    Dispense:  10 tablet    Refill:  0    Supervising Provider:   CLEOTILDE ROGUE [[id number]]    -I have reviewed the patients home medicines and have made adjustments as needed  Co morbidities that complicate the patient evaluation  Past Medical History:  Diagnosis Date   Asthma    Chronic back pain    COPD (chronic obstructive pulmonary disease) (HCC)    Polysubstance abuse (HCC)    Vertigo       Dispostion: Discharged in stable condition.  Return precaution discussed.  Patient voices understanding and is in agreement with the plan.   Final diagnoses:  COPD exacerbation (HCC)  Cellulitis of toe of left foot    ED Discharge Orders          Ordered    doxycycline  (VIBRA -TABS) 100 MG tablet   2 times daily        07/05/24 1215    predniSONE  (DELTASONE ) 20 MG tablet  Daily with breakfast        07/05/24 1215               Hildegard Loge, PA-C 07/05/24 1241    Neysa Caron PARAS, DO 07/05/24 1458

## 2024-07-05 NOTE — ED Notes (Signed)
 Pt o2 sat at 93 and dropped to 87 briefly then went back up, asked if he's short of breath, pt states no but his foot hurts.

## 2024-07-05 NOTE — Discharge Instructions (Addendum)
 You stated that when you walked you felt good and did not have any significant shortness of breath.  We discussed the brief drop in your oxygen level down to 87%.  You felt that this was an error because your hand was raised when the drop occurred.  I did offer to repeat this and if it is truly low to keep you in the hospital but you declined at this time.  Please not hesitate to come back to the emergency room for any worsening symptoms.  I have sent antibiotics and prednisone  into the pharmacy.  This will cover both the redness of your toes which may be cellulitis, and your COPD exacerbation.  Establish a primary care doctor.  Information listed above.

## 2024-07-05 NOTE — ED Triage Notes (Signed)
 Pt states he has not been feeling well recently, for past few days he has had cough, congestion, fevers and chills. Pt also has left foot pain.

## 2024-12-28 ENCOUNTER — Emergency Department (HOSPITAL_COMMUNITY): Payer: Self-pay

## 2024-12-28 ENCOUNTER — Emergency Department (HOSPITAL_COMMUNITY)
Admission: EM | Admit: 2024-12-28 | Discharge: 2024-12-28 | Disposition: A | Discharge status: Home / Self Care | Payer: Self-pay | Attending: Emergency Medicine | Admitting: Emergency Medicine

## 2024-12-28 ENCOUNTER — Encounter (HOSPITAL_COMMUNITY): Payer: Self-pay

## 2024-12-28 DIAGNOSIS — R0602 Shortness of breath: Secondary | ICD-10-CM | POA: Insufficient documentation

## 2024-12-28 DIAGNOSIS — J449 Chronic obstructive pulmonary disease, unspecified: Secondary | ICD-10-CM | POA: Insufficient documentation

## 2024-12-28 DIAGNOSIS — D72829 Elevated white blood cell count, unspecified: Secondary | ICD-10-CM | POA: Insufficient documentation

## 2024-12-28 LAB — CBC WITH DIFFERENTIAL/PLATELET
Abs Immature Granulocytes: 0.08 K/uL — ABNORMAL HIGH (ref 0.00–0.07)
Basophils Absolute: 0.1 K/uL (ref 0.0–0.1)
Basophils Relative: 0 %
Eosinophils Absolute: 0.2 K/uL (ref 0.0–0.5)
Eosinophils Relative: 2 %
HCT: 43.5 % (ref 39.0–52.0)
Hemoglobin: 14.6 g/dL (ref 13.0–17.0)
Immature Granulocytes: 1 %
Lymphocytes Relative: 10 %
Lymphs Abs: 1.2 K/uL (ref 0.7–4.0)
MCH: 30 pg (ref 26.0–34.0)
MCHC: 33.6 g/dL (ref 30.0–36.0)
MCV: 89.3 fL (ref 80.0–100.0)
Monocytes Absolute: 1 K/uL (ref 0.1–1.0)
Monocytes Relative: 8 %
Neutro Abs: 10 K/uL — ABNORMAL HIGH (ref 1.7–7.7)
Neutrophils Relative %: 79 %
Platelets: 295 K/uL (ref 150–400)
RBC: 4.87 MIL/uL (ref 4.22–5.81)
RDW: 12.5 % (ref 11.5–15.5)
WBC: 12.6 K/uL — ABNORMAL HIGH (ref 4.0–10.5)
nRBC: 0 % (ref 0.0–0.2)

## 2024-12-28 LAB — BASIC METABOLIC PANEL WITH GFR
Anion gap: 11 (ref 5–15)
BUN: 19 mg/dL (ref 6–20)
CO2: 25 mmol/L (ref 22–32)
Calcium: 9 mg/dL (ref 8.9–10.3)
Chloride: 103 mmol/L (ref 98–111)
Creatinine, Ser: 0.86 mg/dL (ref 0.61–1.24)
GFR, Estimated: >60 mL/min
Glucose, Bld: 104 mg/dL — ABNORMAL HIGH (ref 70–99)
Potassium: 3.5 mmol/L (ref 3.5–5.1)
Sodium: 138 mmol/L (ref 135–145)

## 2024-12-28 LAB — RESP PANEL BY RT-PCR (RSV, FLU A&B, COVID)  RVPGX2
Influenza A by PCR: NEGATIVE
Influenza B by PCR: NEGATIVE
Resp Syncytial Virus by PCR: NEGATIVE
SARS Coronavirus 2 by RT PCR: NEGATIVE

## 2024-12-28 MED ORDER — PREDNISONE 20 MG PO TABS
60.0000 mg | ORAL_TABLET | Freq: Once | ORAL | Status: AC
  Administered 2024-12-28: 60 mg via ORAL
  Filled 2024-12-28: qty 3

## 2024-12-28 MED ORDER — ALBUTEROL SULFATE HFA 108 (90 BASE) MCG/ACT IN AERS
1.0000 | INHALATION_SPRAY | RESPIRATORY_TRACT | Status: DC
  Administered 2024-12-28: 1 via RESPIRATORY_TRACT
  Filled 2024-12-28: qty 6.7

## 2024-12-28 MED ORDER — DEXAMETHASONE SOD PHOSPHATE PF 10 MG/ML IJ SOLN
10.0000 mg | Freq: Once | INTRAMUSCULAR | Status: AC
  Administered 2024-12-28: 10 mg via INTRAMUSCULAR
  Filled 2024-12-28: qty 1

## 2024-12-28 MED ORDER — ALBUTEROL SULFATE (2.5 MG/3ML) 0.083% IN NEBU
5.0000 mg | INHALATION_SOLUTION | Freq: Once | RESPIRATORY_TRACT | Status: AC
  Administered 2024-12-28: 5 mg via RESPIRATORY_TRACT
  Filled 2024-12-28: qty 6

## 2024-12-28 MED ORDER — IPRATROPIUM-ALBUTEROL 0.5-2.5 (3) MG/3ML IN SOLN
3.0000 mL | Freq: Once | RESPIRATORY_TRACT | Status: AC
  Administered 2024-12-28: 3 mL via RESPIRATORY_TRACT
  Filled 2024-12-28: qty 3

## 2024-12-28 NOTE — ED Triage Notes (Signed)
 Pt to ED c/o SHOB x 3 days, hx COPD. Reports known sick contacts, also out of inhaler

## 2024-12-28 NOTE — Discharge Instructions (Signed)
 Use the provided albuterol  every 4 hours for the next 2 days.  You may then use it as needed.  Follow-up with either your physician or our [facility].

## 2024-12-28 NOTE — ED Provider Triage Note (Addendum)
 Emergency Medicine Provider Triage Evaluation Note  Robert Swanson , a 59 y.o. male  was evaluated in triage.  Pt complains of wheezing, cough, SHOB x 3 days. Hx COPD. Out of inhaler. No fevers, does report body aches and sick contacts.   Review of Systems  Positive:  Negative:   Physical Exam  BP 135/73 (BP Location: Right Arm)   Pulse 100   Temp 98 F (36.7 C)   Resp 16   SpO2 95%  Gen:   Awake, no distress   Resp:  Normal effort, diffuse wheezing   MSK:   Moves extremities without difficulty  Other:    Medical Decision Making  Medically screening exam initiated at 1:59 AM.  Appropriate orders placed.  [name] was informed that the remainder of the evaluation will be completed by another provider, this initial triage assessment does not replace that evaluation, and the importance of remaining in the ED until their evaluation is complete.  0337am patient reassessed, diffuse wheezing. Ambulatory pulse ox checked, able to maintain O2 sat 95% on room air.  Add on labs and albuterol  neb.    Beverley Leita LABOR, PA-C 12/28/24 0159    Beverley Leita LABOR, PA-C 12/28/24 [phone]

## 2024-12-28 NOTE — ED Provider Notes (Signed)
 " [place] EMERGENCY DEPARTMENT AT [facility] Provider Note   CSN: [id number] Arrival date & time: 12/28/24  0141     Patient presents with: Shortness of Breath   Robert Swanson is a 59 y.o. male.   HPI Patient with a history of COPD with shortness of breath for 3 days.  He ran out of his inhaler.  No fever, no pain.    Prior to Admission medications  Medication Sig Start Date End Date Taking? Authorizing Provider  albuterol  (PROVENTIL  HFA;VENTOLIN  HFA) 108 (90 Base) MCG/ACT inhaler Inhale 2 puffs into the lungs every 4 (four) hours as needed for wheezing or shortness of breath. 10/26/18   Ward, Josette SAILOR, DO  doxycycline  (VIBRA -TABS) 100 MG tablet Take 1 tablet (100 mg total) by mouth 2 (two) times daily. 07/05/24   Hildegard Loge, PA-C  naproxen sodium (ALEVE) 220 MG tablet Take 440 mg by mouth 2 (two) times daily as needed (pain).    [provider]  predniSONE  (DELTASONE ) 20 MG tablet Take 3 tablets (60 mg total) by mouth daily. Patient not taking: Reported on 03/04/2019 10/26/18   Ward, Josette SAILOR, DO    Allergies: Amoxicillin    Review of Systems  Updated Vital Signs BP 116/70   Pulse 90   Temp 98.3 F (36.8 C)   Resp 17   SpO2 94%   Physical Exam Vitals and nursing note reviewed.  Constitutional:      General: He is not in acute distress.    Appearance: He is well-developed.  HENT:     Head: Normocephalic and atraumatic.  Eyes:     Conjunctiva/sclera: Conjunctivae normal.  Cardiovascular:     Rate and Rhythm: Normal rate and regular rhythm.  Pulmonary:     Effort: Pulmonary effort is normal. No respiratory distress.     Breath sounds: No stridor.  Abdominal:     General: There is no distension.  Skin:    General: Skin is warm and dry.  Neurological:     Mental Status: He is alert and oriented to person, place, and time.     (all labs ordered are listed, but only abnormal results are displayed) Labs Reviewed  CBC WITH  DIFFERENTIAL/PLATELET - Abnormal; Notable for the following components:      Result Value   WBC 12.6 (*)    Neutro Abs 10.0 (*)    Abs Immature Granulocytes 0.08 (*)    All other components within normal limits  BASIC METABOLIC PANEL WITH GFR - Abnormal; Notable for the following components:   Glucose, Bld 104 (*)    All other components within normal limits  RESP PANEL BY RT-PCR (RSV, FLU A&B, COVID)  [id number]    EKG: None  Radiology: DG Chest 2 View Result Date: 12/28/2024 EXAM: 2 VIEW(S) XRAY OF THE CHEST 12/28/2024 02:06:00 AM COMPARISON: 07/05/2024 CLINICAL HISTORY: The patient presents with cough and wheezing. FINDINGS: LUNGS AND PLEURA: Mild central bronchial wall thickening. No focal pulmonary opacity. No pleural effusion. No pneumothorax. HEART AND MEDIASTINUM: No acute abnormality of the cardiac and mediastinal silhouettes. BONES AND SOFT TISSUES: No acute osseous abnormality. IMPRESSION: 1. Mild central bronchial wall thickening. No focal confluent infiltrate is seen. Electronically signed by: Oneil Devonshire MD 12/28/2024 02:26 AM EST RP Workstation: [id number]     Procedures   Medications Ordered in the ED  albuterol  (VENTOLIN  HFA) 108 (90 Base) MCG/ACT inhaler 1 puff (has no administration in time range)  dexamethasone  (DECADRON ) injection 10 mg (has no administration  in time range)  ipratropium-albuterol  (DUONEB) 0.5-2.5 (3) MG/3ML nebulizer solution 3 mL (3 mLs Nebulization Given 12/28/24 0225)  predniSONE  (DELTASONE ) tablet 60 mg (60 mg Oral Given 12/28/24 0224)  albuterol  (PROVENTIL ) (2.5 MG/3ML) 0.083% nebulizer solution 5 mg (5 mg Nebulization Given 12/28/24 0343)                                    Medical Decision Making Adult male with history of PE presents with shortness of breath for several days. Is awake, in no distress, without substantial increased work of breathing, x-ray reviewed, labs reviewed, no pneumonia, flu, COVID.  With no hypoxia, new oxygen  requirement, pneumonia, patient received albuterol  inhaler here, steroids oral and injection, will follow-up with primary care.  Amount and/or Complexity of Data Reviewed Labs:  Decision-making details documented in ED Course. Radiology: independent interpretation performed. Decision-making details documented in ED Course.  Risk Prescription drug management. Decision regarding hospitalization. Diagnosis or treatment significantly limited by social determinants of health.     Final diagnoses:  SOB (shortness of breath)    ED Discharge Orders     None          Garrick Charleston, MD 12/28/24 [phone]  "
